# Patient Record
Sex: Female | Born: 1935 | Race: Black or African American | Hispanic: No | Marital: Single | Smoking: Former smoker
Health system: Southern US, Community
[De-identification: ages and names within clinical notes are randomized; demographics above are authoritative.]

## PROBLEM LIST (undated history)

## (undated) DIAGNOSIS — M1990 Unspecified osteoarthritis, unspecified site: Secondary | ICD-10-CM

## (undated) DIAGNOSIS — I471 Supraventricular tachycardia: Secondary | ICD-10-CM

## (undated) DIAGNOSIS — F419 Anxiety disorder, unspecified: Secondary | ICD-10-CM

## (undated) DIAGNOSIS — R0600 Dyspnea, unspecified: Secondary | ICD-10-CM

## (undated) DIAGNOSIS — I4719 Other supraventricular tachycardia: Secondary | ICD-10-CM

## (undated) DIAGNOSIS — F32A Depression, unspecified: Secondary | ICD-10-CM

## (undated) DIAGNOSIS — Z8719 Personal history of other diseases of the digestive system: Secondary | ICD-10-CM

## (undated) DIAGNOSIS — K5909 Other constipation: Secondary | ICD-10-CM

## (undated) DIAGNOSIS — R739 Hyperglycemia, unspecified: Secondary | ICD-10-CM

## (undated) DIAGNOSIS — I252 Old myocardial infarction: Secondary | ICD-10-CM

## (undated) DIAGNOSIS — G379 Demyelinating disease of central nervous system, unspecified: Secondary | ICD-10-CM

## (undated) DIAGNOSIS — J449 Chronic obstructive pulmonary disease, unspecified: Secondary | ICD-10-CM

## (undated) DIAGNOSIS — K279 Peptic ulcer, site unspecified, unspecified as acute or chronic, without hemorrhage or perforation: Secondary | ICD-10-CM

## (undated) DIAGNOSIS — I739 Peripheral vascular disease, unspecified: Secondary | ICD-10-CM

## (undated) DIAGNOSIS — D509 Iron deficiency anemia, unspecified: Secondary | ICD-10-CM

## (undated) DIAGNOSIS — G629 Polyneuropathy, unspecified: Secondary | ICD-10-CM

## (undated) DIAGNOSIS — I10 Essential (primary) hypertension: Secondary | ICD-10-CM

## (undated) DIAGNOSIS — R0609 Other forms of dyspnea: Secondary | ICD-10-CM

## (undated) DIAGNOSIS — F329 Major depressive disorder, single episode, unspecified: Secondary | ICD-10-CM

## (undated) DIAGNOSIS — I509 Heart failure, unspecified: Secondary | ICD-10-CM

## (undated) HISTORY — DX: Heart failure, unspecified: I50.9

## (undated) HISTORY — DX: Depression, unspecified: F32.A

## (undated) HISTORY — DX: Chronic obstructive pulmonary disease, unspecified: J44.9

## (undated) HISTORY — DX: Hyperglycemia, unspecified: R73.9

## (undated) HISTORY — DX: Peptic ulcer, site unspecified, unspecified as acute or chronic, without hemorrhage or perforation: K27.9

## (undated) HISTORY — DX: Old myocardial infarction: I25.2

## (undated) HISTORY — DX: Peripheral vascular disease, unspecified: I73.9

## (undated) HISTORY — DX: Anxiety disorder, unspecified: F41.9

## (undated) HISTORY — DX: Iron deficiency anemia, unspecified: D50.9

## (undated) HISTORY — DX: Major depressive disorder, single episode, unspecified: F32.9

## (undated) HISTORY — DX: Demyelinating disease of central nervous system, unspecified: G37.9

## (undated) HISTORY — PX: SPLENECTOMY: SUR1306

## (undated) HISTORY — DX: Personal history of other diseases of the digestive system: Z87.19

## (undated) HISTORY — PX: CHOLECYSTECTOMY: SHX55

## (undated) HISTORY — DX: Other supraventricular tachycardia: I47.19

## (undated) HISTORY — PX: CARDIAC CATHETERIZATION: SHX172

## (undated) HISTORY — DX: Dyspnea, unspecified: R06.00

## (undated) HISTORY — DX: Other constipation: K59.09

## (undated) HISTORY — PX: PARTIAL GASTRECTOMY: SHX2172

## (undated) HISTORY — DX: Essential (primary) hypertension: I10

## (undated) HISTORY — DX: Supraventricular tachycardia: I47.1

## (undated) HISTORY — DX: Other forms of dyspnea: R06.09

## (undated) HISTORY — DX: Unspecified osteoarthritis, unspecified site: M19.90

## (undated) HISTORY — DX: Polyneuropathy, unspecified: G62.9

---

## 1997-12-16 ENCOUNTER — Ambulatory Visit (HOSPITAL_COMMUNITY): Admission: RE | Admit: 1997-12-16 | Discharge: 1997-12-16 | Payer: Self-pay | Admitting: Gastroenterology

## 1999-10-29 ENCOUNTER — Ambulatory Visit (HOSPITAL_COMMUNITY): Admission: RE | Admit: 1999-10-29 | Discharge: 1999-10-29 | Payer: Self-pay | Admitting: Surgery

## 2000-10-14 ENCOUNTER — Encounter (HOSPITAL_COMMUNITY): Admission: RE | Admit: 2000-10-14 | Discharge: 2000-11-13 | Payer: Self-pay | Admitting: Oncology

## 2000-10-14 ENCOUNTER — Encounter (HOSPITAL_COMMUNITY): Payer: Self-pay | Admitting: Oncology

## 2000-10-14 ENCOUNTER — Encounter: Admission: RE | Admit: 2000-10-14 | Discharge: 2000-10-14 | Payer: Self-pay | Admitting: Oncology

## 2000-11-21 ENCOUNTER — Encounter: Admission: RE | Admit: 2000-11-21 | Discharge: 2000-11-21 | Payer: Self-pay | Admitting: Oncology

## 2000-11-21 ENCOUNTER — Encounter (HOSPITAL_COMMUNITY): Admission: RE | Admit: 2000-11-21 | Discharge: 2000-12-21 | Payer: Self-pay | Admitting: Oncology

## 2001-01-21 ENCOUNTER — Encounter (HOSPITAL_COMMUNITY): Admission: RE | Admit: 2001-01-21 | Discharge: 2001-02-20 | Payer: Self-pay | Admitting: Oncology

## 2001-01-21 ENCOUNTER — Encounter: Admission: RE | Admit: 2001-01-21 | Discharge: 2001-01-21 | Payer: Self-pay | Admitting: Oncology

## 2001-03-02 ENCOUNTER — Encounter (HOSPITAL_COMMUNITY): Admission: RE | Admit: 2001-03-02 | Discharge: 2001-04-01 | Payer: Self-pay | Admitting: Oncology

## 2001-03-02 ENCOUNTER — Encounter: Admission: RE | Admit: 2001-03-02 | Discharge: 2001-03-02 | Payer: Self-pay | Admitting: Oncology

## 2001-04-28 ENCOUNTER — Encounter (HOSPITAL_COMMUNITY): Admission: RE | Admit: 2001-04-28 | Discharge: 2001-05-28 | Payer: Self-pay | Admitting: Oncology

## 2001-04-28 ENCOUNTER — Encounter: Admission: RE | Admit: 2001-04-28 | Discharge: 2001-04-28 | Payer: Self-pay | Admitting: Oncology

## 2001-08-12 ENCOUNTER — Encounter (HOSPITAL_COMMUNITY): Admission: RE | Admit: 2001-08-12 | Discharge: 2001-09-11 | Payer: Self-pay | Admitting: Oncology

## 2001-08-12 ENCOUNTER — Encounter: Admission: RE | Admit: 2001-08-12 | Discharge: 2001-08-12 | Payer: Self-pay | Admitting: Oncology

## 2001-09-16 ENCOUNTER — Ambulatory Visit (HOSPITAL_COMMUNITY): Admission: RE | Admit: 2001-09-16 | Discharge: 2001-09-16 | Payer: Self-pay | Admitting: General Surgery

## 2001-09-17 ENCOUNTER — Encounter: Payer: Self-pay | Admitting: General Surgery

## 2001-09-17 ENCOUNTER — Encounter (HOSPITAL_COMMUNITY): Admission: RE | Admit: 2001-09-17 | Discharge: 2001-10-17 | Payer: Self-pay | Admitting: General Surgery

## 2001-09-18 ENCOUNTER — Encounter: Payer: Self-pay | Admitting: General Surgery

## 2001-10-13 ENCOUNTER — Observation Stay (HOSPITAL_COMMUNITY): Admission: RE | Admit: 2001-10-13 | Discharge: 2001-10-14 | Payer: Self-pay | Admitting: General Surgery

## 2001-11-04 ENCOUNTER — Encounter: Admission: RE | Admit: 2001-11-04 | Discharge: 2001-11-04 | Payer: Self-pay | Admitting: Oncology

## 2001-11-04 ENCOUNTER — Encounter (HOSPITAL_COMMUNITY): Admission: RE | Admit: 2001-11-04 | Discharge: 2001-12-04 | Payer: Self-pay | Admitting: Oncology

## 2002-01-04 ENCOUNTER — Encounter (HOSPITAL_COMMUNITY): Admission: RE | Admit: 2002-01-04 | Discharge: 2002-02-03 | Payer: Self-pay | Admitting: Oncology

## 2002-01-04 ENCOUNTER — Encounter: Admission: RE | Admit: 2002-01-04 | Discharge: 2002-01-04 | Payer: Self-pay | Admitting: Oncology

## 2002-03-03 ENCOUNTER — Encounter (HOSPITAL_COMMUNITY): Admission: RE | Admit: 2002-03-03 | Discharge: 2002-04-02 | Payer: Self-pay | Admitting: Rheumatology

## 2002-03-03 ENCOUNTER — Encounter: Admission: RE | Admit: 2002-03-03 | Discharge: 2002-03-03 | Payer: Self-pay | Admitting: Oncology

## 2002-06-21 ENCOUNTER — Inpatient Hospital Stay (HOSPITAL_COMMUNITY): Admission: EM | Admit: 2002-06-21 | Discharge: 2002-06-26 | Payer: Self-pay | Admitting: Emergency Medicine

## 2002-06-24 ENCOUNTER — Encounter: Payer: Self-pay | Admitting: General Surgery

## 2002-07-16 ENCOUNTER — Inpatient Hospital Stay (HOSPITAL_COMMUNITY): Admission: RE | Admit: 2002-07-16 | Discharge: 2002-07-19 | Payer: Self-pay | Admitting: General Surgery

## 2002-12-14 ENCOUNTER — Inpatient Hospital Stay (HOSPITAL_COMMUNITY): Admission: EM | Admit: 2002-12-14 | Discharge: 2002-12-30 | Payer: Self-pay | Admitting: *Deleted

## 2002-12-14 ENCOUNTER — Encounter: Payer: Self-pay | Admitting: *Deleted

## 2002-12-15 ENCOUNTER — Encounter: Payer: Self-pay | Admitting: Neurology

## 2002-12-15 ENCOUNTER — Encounter: Payer: Self-pay | Admitting: General Surgery

## 2002-12-20 ENCOUNTER — Encounter: Payer: Self-pay | Admitting: Internal Medicine

## 2002-12-20 ENCOUNTER — Encounter: Payer: Self-pay | Admitting: Neurology

## 2002-12-25 ENCOUNTER — Encounter: Payer: Self-pay | Admitting: General Surgery

## 2003-02-08 ENCOUNTER — Encounter: Admission: RE | Admit: 2003-02-08 | Discharge: 2003-02-08 | Payer: Self-pay | Admitting: Oncology

## 2003-02-08 ENCOUNTER — Encounter (HOSPITAL_COMMUNITY): Admission: RE | Admit: 2003-02-08 | Discharge: 2003-03-10 | Payer: Self-pay | Admitting: Oncology

## 2003-09-02 ENCOUNTER — Encounter: Admission: RE | Admit: 2003-09-02 | Discharge: 2003-09-02 | Payer: Self-pay | Admitting: Oncology

## 2003-09-02 ENCOUNTER — Encounter (HOSPITAL_COMMUNITY): Admission: RE | Admit: 2003-09-02 | Discharge: 2003-10-02 | Payer: Self-pay | Admitting: Oncology

## 2003-10-05 ENCOUNTER — Encounter: Admission: RE | Admit: 2003-10-05 | Discharge: 2003-10-05 | Payer: Self-pay | Admitting: Oncology

## 2004-03-07 ENCOUNTER — Encounter: Admission: RE | Admit: 2004-03-07 | Discharge: 2004-04-06 | Payer: Self-pay | Admitting: Oncology

## 2004-03-07 ENCOUNTER — Encounter (HOSPITAL_COMMUNITY): Admission: RE | Admit: 2004-03-07 | Discharge: 2004-04-06 | Payer: Self-pay | Admitting: Oncology

## 2004-03-10 ENCOUNTER — Inpatient Hospital Stay (HOSPITAL_COMMUNITY): Admission: EM | Admit: 2004-03-10 | Discharge: 2004-03-15 | Payer: Self-pay | Admitting: Internal Medicine

## 2004-03-26 ENCOUNTER — Encounter (HOSPITAL_COMMUNITY): Admission: RE | Admit: 2004-03-26 | Discharge: 2004-03-27 | Payer: Self-pay | Admitting: Endocrinology

## 2005-04-29 ENCOUNTER — Ambulatory Visit (HOSPITAL_COMMUNITY): Admission: RE | Admit: 2005-04-29 | Discharge: 2005-04-29 | Payer: Self-pay | Admitting: General Surgery

## 2005-04-29 ENCOUNTER — Ambulatory Visit: Payer: Self-pay | Admitting: *Deleted

## 2005-05-15 ENCOUNTER — Ambulatory Visit (HOSPITAL_COMMUNITY): Admission: RE | Admit: 2005-05-15 | Discharge: 2005-05-15 | Payer: Self-pay | Admitting: General Surgery

## 2005-05-30 ENCOUNTER — Inpatient Hospital Stay (HOSPITAL_COMMUNITY): Admission: EM | Admit: 2005-05-30 | Discharge: 2005-06-01 | Payer: Self-pay | Admitting: Emergency Medicine

## 2005-05-31 ENCOUNTER — Encounter (INDEPENDENT_AMBULATORY_CARE_PROVIDER_SITE_OTHER): Payer: Self-pay | Admitting: General Surgery

## 2005-07-09 ENCOUNTER — Ambulatory Visit (HOSPITAL_COMMUNITY): Admission: RE | Admit: 2005-07-09 | Discharge: 2005-07-09 | Payer: Self-pay | Admitting: General Surgery

## 2006-02-17 ENCOUNTER — Ambulatory Visit: Payer: Self-pay | Admitting: Internal Medicine

## 2006-02-28 ENCOUNTER — Ambulatory Visit: Payer: Self-pay | Admitting: Internal Medicine

## 2006-02-28 LAB — CONVERTED CEMR LAB
Hemoglobin, Urine: NEGATIVE
Leukocytes, UA: NEGATIVE
Protein, ur: NEGATIVE mg/dL
Urine Glucose: NEGATIVE mg/dL

## 2006-03-04 LAB — CONVERTED CEMR LAB
HDL: 49 mg/dL
Total CHOL/HDL Ratio: 4

## 2006-03-28 ENCOUNTER — Ambulatory Visit: Payer: Self-pay | Admitting: Internal Medicine

## 2006-04-03 ENCOUNTER — Inpatient Hospital Stay (HOSPITAL_COMMUNITY): Admission: EM | Admit: 2006-04-03 | Discharge: 2006-04-08 | Payer: Self-pay | Admitting: Emergency Medicine

## 2006-04-03 ENCOUNTER — Ambulatory Visit: Payer: Self-pay | Admitting: Internal Medicine

## 2006-04-10 ENCOUNTER — Ambulatory Visit (HOSPITAL_COMMUNITY): Admission: RE | Admit: 2006-04-10 | Discharge: 2006-04-10 | Payer: Self-pay | Admitting: Internal Medicine

## 2006-04-10 ENCOUNTER — Ambulatory Visit: Payer: Self-pay | Admitting: Internal Medicine

## 2006-04-21 ENCOUNTER — Ambulatory Visit (HOSPITAL_COMMUNITY): Admission: RE | Admit: 2006-04-21 | Discharge: 2006-04-21 | Payer: Self-pay | Admitting: Gastroenterology

## 2006-04-22 ENCOUNTER — Ambulatory Visit: Payer: Self-pay | Admitting: Gastroenterology

## 2006-04-25 ENCOUNTER — Ambulatory Visit: Payer: Self-pay | Admitting: Internal Medicine

## 2006-04-30 ENCOUNTER — Ambulatory Visit: Payer: Self-pay | Admitting: Cardiovascular Disease

## 2006-05-06 ENCOUNTER — Encounter (HOSPITAL_COMMUNITY): Admission: RE | Admit: 2006-05-06 | Discharge: 2006-06-05 | Payer: Self-pay | Admitting: Cardiovascular Disease

## 2006-05-08 ENCOUNTER — Ambulatory Visit: Payer: Self-pay | Admitting: Gastroenterology

## 2006-05-23 ENCOUNTER — Ambulatory Visit: Payer: Self-pay | Admitting: Internal Medicine

## 2006-05-28 DIAGNOSIS — J4489 Other specified chronic obstructive pulmonary disease: Secondary | ICD-10-CM | POA: Insufficient documentation

## 2006-05-28 DIAGNOSIS — I10 Essential (primary) hypertension: Secondary | ICD-10-CM | POA: Insufficient documentation

## 2006-05-28 DIAGNOSIS — Z8719 Personal history of other diseases of the digestive system: Secondary | ICD-10-CM | POA: Insufficient documentation

## 2006-05-28 DIAGNOSIS — F329 Major depressive disorder, single episode, unspecified: Secondary | ICD-10-CM | POA: Insufficient documentation

## 2006-05-28 DIAGNOSIS — I739 Peripheral vascular disease, unspecified: Secondary | ICD-10-CM

## 2006-05-28 DIAGNOSIS — G609 Hereditary and idiopathic neuropathy, unspecified: Secondary | ICD-10-CM | POA: Insufficient documentation

## 2006-05-28 DIAGNOSIS — F3289 Other specified depressive episodes: Secondary | ICD-10-CM | POA: Insufficient documentation

## 2006-05-28 DIAGNOSIS — K5909 Other constipation: Secondary | ICD-10-CM | POA: Insufficient documentation

## 2006-05-28 DIAGNOSIS — F411 Generalized anxiety disorder: Secondary | ICD-10-CM | POA: Insufficient documentation

## 2006-05-28 DIAGNOSIS — I252 Old myocardial infarction: Secondary | ICD-10-CM

## 2006-05-28 DIAGNOSIS — M1990 Unspecified osteoarthritis, unspecified site: Secondary | ICD-10-CM | POA: Insufficient documentation

## 2006-05-28 DIAGNOSIS — M129 Arthropathy, unspecified: Secondary | ICD-10-CM | POA: Insufficient documentation

## 2006-05-28 DIAGNOSIS — K279 Peptic ulcer, site unspecified, unspecified as acute or chronic, without hemorrhage or perforation: Secondary | ICD-10-CM | POA: Insufficient documentation

## 2006-05-28 DIAGNOSIS — J449 Chronic obstructive pulmonary disease, unspecified: Secondary | ICD-10-CM | POA: Insufficient documentation

## 2006-06-10 LAB — CONVERTED CEMR LAB
Free T4: 0.91 ng/dL
TSH: 0.3 microintl units/mL

## 2006-06-13 ENCOUNTER — Ambulatory Visit: Payer: Self-pay | Admitting: Internal Medicine

## 2006-07-11 ENCOUNTER — Ambulatory Visit: Payer: Self-pay | Admitting: Internal Medicine

## 2006-08-08 ENCOUNTER — Ambulatory Visit: Payer: Self-pay | Admitting: Internal Medicine

## 2006-08-09 ENCOUNTER — Encounter (INDEPENDENT_AMBULATORY_CARE_PROVIDER_SITE_OTHER): Payer: Self-pay | Admitting: Internal Medicine

## 2006-08-09 LAB — CONVERTED CEMR LAB
BUN: 18 mg/dL (ref 6–23)
CO2: 27 meq/L (ref 19–32)
Calcium: 9 mg/dL (ref 8.4–10.5)
Creatinine, Ser: 1.01 mg/dL (ref 0.40–1.20)
Eosinophils Relative: 1 % (ref 0–5)
Glucose, Bld: 185 mg/dL — ABNORMAL HIGH (ref 70–99)
HCT: 38.1 % (ref 36.0–46.0)
Hemoglobin: 11.9 g/dL — ABNORMAL LOW (ref 12.0–15.0)
Lymphocytes Relative: 24 % (ref 12–46)
Lymphs Abs: 1.6 10*3/uL (ref 0.7–3.3)
MCV: 89 fL (ref 78.0–100.0)
Monocytes Absolute: 0.5 10*3/uL (ref 0.2–0.7)
Monocytes Relative: 7 % (ref 3–11)
RBC: 4.28 M/uL (ref 3.87–5.11)
Saturation Ratios: 12 % — ABNORMAL LOW (ref 20–55)
Sodium: 144 meq/L (ref 135–145)
WBC: 6.5 10*3/uL (ref 4.0–10.5)

## 2006-08-28 ENCOUNTER — Ambulatory Visit: Payer: Self-pay | Admitting: Gastroenterology

## 2006-08-28 ENCOUNTER — Encounter (INDEPENDENT_AMBULATORY_CARE_PROVIDER_SITE_OTHER): Payer: Self-pay | Admitting: Internal Medicine

## 2006-09-03 ENCOUNTER — Encounter (INDEPENDENT_AMBULATORY_CARE_PROVIDER_SITE_OTHER): Payer: Self-pay | Admitting: Internal Medicine

## 2006-09-04 ENCOUNTER — Ambulatory Visit: Payer: Self-pay | Admitting: Internal Medicine

## 2006-09-04 ENCOUNTER — Ambulatory Visit (HOSPITAL_COMMUNITY): Admission: RE | Admit: 2006-09-04 | Discharge: 2006-09-04 | Payer: Self-pay | Admitting: Internal Medicine

## 2006-09-04 DIAGNOSIS — R0989 Other specified symptoms and signs involving the circulatory and respiratory systems: Secondary | ICD-10-CM

## 2006-09-04 DIAGNOSIS — R42 Dizziness and giddiness: Secondary | ICD-10-CM

## 2006-09-18 ENCOUNTER — Encounter (INDEPENDENT_AMBULATORY_CARE_PROVIDER_SITE_OTHER): Payer: Self-pay | Admitting: Internal Medicine

## 2006-09-19 ENCOUNTER — Encounter (INDEPENDENT_AMBULATORY_CARE_PROVIDER_SITE_OTHER): Payer: Self-pay | Admitting: Internal Medicine

## 2006-10-10 ENCOUNTER — Encounter (INDEPENDENT_AMBULATORY_CARE_PROVIDER_SITE_OTHER): Payer: Self-pay | Admitting: Internal Medicine

## 2006-10-14 ENCOUNTER — Ambulatory Visit: Payer: Self-pay | Admitting: Internal Medicine

## 2006-10-14 DIAGNOSIS — R0789 Other chest pain: Secondary | ICD-10-CM | POA: Insufficient documentation

## 2006-10-14 DIAGNOSIS — R112 Nausea with vomiting, unspecified: Secondary | ICD-10-CM | POA: Insufficient documentation

## 2006-10-14 DIAGNOSIS — F17200 Nicotine dependence, unspecified, uncomplicated: Secondary | ICD-10-CM | POA: Insufficient documentation

## 2006-10-14 DIAGNOSIS — Z9119 Patient's noncompliance with other medical treatment and regimen: Secondary | ICD-10-CM | POA: Insufficient documentation

## 2006-10-15 ENCOUNTER — Inpatient Hospital Stay (HOSPITAL_COMMUNITY): Admission: EM | Admit: 2006-10-15 | Discharge: 2006-10-25 | Payer: Self-pay | Admitting: Emergency Medicine

## 2006-10-15 ENCOUNTER — Telehealth (INDEPENDENT_AMBULATORY_CARE_PROVIDER_SITE_OTHER): Payer: Self-pay | Admitting: Internal Medicine

## 2006-10-16 ENCOUNTER — Ambulatory Visit: Payer: Self-pay | Admitting: Gastroenterology

## 2006-10-16 ENCOUNTER — Encounter (INDEPENDENT_AMBULATORY_CARE_PROVIDER_SITE_OTHER): Payer: Self-pay | Admitting: Specialist

## 2006-10-18 ENCOUNTER — Ambulatory Visit: Payer: Self-pay | Admitting: Internal Medicine

## 2006-10-30 ENCOUNTER — Encounter (INDEPENDENT_AMBULATORY_CARE_PROVIDER_SITE_OTHER): Payer: Self-pay | Admitting: Internal Medicine

## 2006-11-02 ENCOUNTER — Inpatient Hospital Stay (HOSPITAL_COMMUNITY): Admission: EM | Admit: 2006-11-02 | Discharge: 2006-11-10 | Payer: Self-pay | Admitting: Emergency Medicine

## 2006-11-07 ENCOUNTER — Ambulatory Visit: Payer: Self-pay | Admitting: Oncology

## 2006-11-10 ENCOUNTER — Telehealth (INDEPENDENT_AMBULATORY_CARE_PROVIDER_SITE_OTHER): Payer: Self-pay | Admitting: Internal Medicine

## 2006-11-12 ENCOUNTER — Encounter (INDEPENDENT_AMBULATORY_CARE_PROVIDER_SITE_OTHER): Payer: Self-pay | Admitting: Internal Medicine

## 2006-11-12 ENCOUNTER — Telehealth (INDEPENDENT_AMBULATORY_CARE_PROVIDER_SITE_OTHER): Payer: Self-pay | Admitting: *Deleted

## 2006-11-13 ENCOUNTER — Ambulatory Visit: Payer: Self-pay | Admitting: Cardiology

## 2006-11-13 ENCOUNTER — Encounter (INDEPENDENT_AMBULATORY_CARE_PROVIDER_SITE_OTHER): Payer: Self-pay | Admitting: Internal Medicine

## 2006-11-14 ENCOUNTER — Encounter: Payer: Self-pay | Admitting: Cardiology

## 2006-11-20 ENCOUNTER — Telehealth (INDEPENDENT_AMBULATORY_CARE_PROVIDER_SITE_OTHER): Payer: Self-pay | Admitting: Internal Medicine

## 2006-11-20 ENCOUNTER — Encounter (INDEPENDENT_AMBULATORY_CARE_PROVIDER_SITE_OTHER): Payer: Self-pay | Admitting: Internal Medicine

## 2006-11-21 DIAGNOSIS — J9600 Acute respiratory failure, unspecified whether with hypoxia or hypercapnia: Secondary | ICD-10-CM

## 2006-11-21 DIAGNOSIS — K252 Acute gastric ulcer with both hemorrhage and perforation: Secondary | ICD-10-CM | POA: Insufficient documentation

## 2006-11-25 ENCOUNTER — Ambulatory Visit: Payer: Self-pay | Admitting: Internal Medicine

## 2006-11-25 DIAGNOSIS — I509 Heart failure, unspecified: Secondary | ICD-10-CM | POA: Insufficient documentation

## 2006-11-25 DIAGNOSIS — R7989 Other specified abnormal findings of blood chemistry: Secondary | ICD-10-CM | POA: Insufficient documentation

## 2006-11-25 DIAGNOSIS — L02519 Cutaneous abscess of unspecified hand: Secondary | ICD-10-CM

## 2006-11-25 DIAGNOSIS — L03119 Cellulitis of unspecified part of limb: Secondary | ICD-10-CM

## 2006-11-25 LAB — CONVERTED CEMR LAB: Hgb A1c MFr Bld: 6.6 %

## 2006-11-28 ENCOUNTER — Ambulatory Visit: Payer: Self-pay | Admitting: Internal Medicine

## 2006-12-08 ENCOUNTER — Encounter (INDEPENDENT_AMBULATORY_CARE_PROVIDER_SITE_OTHER): Payer: Self-pay | Admitting: Internal Medicine

## 2006-12-11 ENCOUNTER — Ambulatory Visit: Payer: Self-pay | Admitting: Internal Medicine

## 2006-12-11 DIAGNOSIS — Z9089 Acquired absence of other organs: Secondary | ICD-10-CM

## 2006-12-12 ENCOUNTER — Encounter (INDEPENDENT_AMBULATORY_CARE_PROVIDER_SITE_OTHER): Payer: Self-pay | Admitting: Internal Medicine

## 2006-12-12 LAB — CONVERTED CEMR LAB
BUN: 9 mg/dL (ref 6–23)
Calcium: 8.9 mg/dL (ref 8.4–10.5)
Creatinine, Ser: 0.77 mg/dL (ref 0.40–1.20)
Glucose, Bld: 94 mg/dL (ref 70–99)
Potassium: 4.6 meq/L (ref 3.5–5.3)

## 2006-12-15 ENCOUNTER — Encounter (INDEPENDENT_AMBULATORY_CARE_PROVIDER_SITE_OTHER): Payer: Self-pay | Admitting: Internal Medicine

## 2006-12-17 ENCOUNTER — Encounter (INDEPENDENT_AMBULATORY_CARE_PROVIDER_SITE_OTHER): Payer: Self-pay | Admitting: Internal Medicine

## 2006-12-18 ENCOUNTER — Ambulatory Visit: Payer: Self-pay | Admitting: Nurse Practitioner

## 2006-12-19 ENCOUNTER — Ambulatory Visit: Payer: Self-pay | Admitting: Internal Medicine

## 2006-12-23 ENCOUNTER — Telehealth (INDEPENDENT_AMBULATORY_CARE_PROVIDER_SITE_OTHER): Payer: Self-pay | Admitting: Internal Medicine

## 2006-12-26 ENCOUNTER — Ambulatory Visit: Payer: Self-pay | Admitting: Internal Medicine

## 2006-12-26 DIAGNOSIS — S61409A Unspecified open wound of unspecified hand, initial encounter: Secondary | ICD-10-CM | POA: Insufficient documentation

## 2007-01-08 ENCOUNTER — Ambulatory Visit: Payer: Self-pay | Admitting: Internal Medicine

## 2007-02-11 ENCOUNTER — Encounter (INDEPENDENT_AMBULATORY_CARE_PROVIDER_SITE_OTHER): Payer: Self-pay | Admitting: Internal Medicine

## 2007-02-19 ENCOUNTER — Ambulatory Visit: Payer: Self-pay | Admitting: Internal Medicine

## 2007-02-19 DIAGNOSIS — I7389 Other specified peripheral vascular diseases: Secondary | ICD-10-CM

## 2007-02-19 DIAGNOSIS — E119 Type 2 diabetes mellitus without complications: Secondary | ICD-10-CM

## 2007-02-19 DIAGNOSIS — M79609 Pain in unspecified limb: Secondary | ICD-10-CM | POA: Insufficient documentation

## 2007-02-19 LAB — CONVERTED CEMR LAB: Hgb A1c MFr Bld: 6.2 %

## 2007-02-20 ENCOUNTER — Ambulatory Visit: Payer: Self-pay | Admitting: Gastroenterology

## 2007-02-20 ENCOUNTER — Telehealth (INDEPENDENT_AMBULATORY_CARE_PROVIDER_SITE_OTHER): Payer: Self-pay | Admitting: *Deleted

## 2007-02-20 ENCOUNTER — Encounter (INDEPENDENT_AMBULATORY_CARE_PROVIDER_SITE_OTHER): Payer: Self-pay | Admitting: Internal Medicine

## 2007-02-24 ENCOUNTER — Telehealth (INDEPENDENT_AMBULATORY_CARE_PROVIDER_SITE_OTHER): Payer: Self-pay | Admitting: *Deleted

## 2007-02-27 ENCOUNTER — Encounter (INDEPENDENT_AMBULATORY_CARE_PROVIDER_SITE_OTHER): Payer: Self-pay | Admitting: Internal Medicine

## 2007-02-28 ENCOUNTER — Encounter (INDEPENDENT_AMBULATORY_CARE_PROVIDER_SITE_OTHER): Payer: Self-pay | Admitting: Internal Medicine

## 2007-03-11 ENCOUNTER — Ambulatory Visit: Payer: Self-pay | Admitting: Internal Medicine

## 2007-03-27 ENCOUNTER — Ambulatory Visit: Payer: Self-pay | Admitting: Cardiology

## 2007-04-20 ENCOUNTER — Ambulatory Visit: Payer: Self-pay | Admitting: Surgery

## 2007-04-29 ENCOUNTER — Encounter (INDEPENDENT_AMBULATORY_CARE_PROVIDER_SITE_OTHER): Payer: Self-pay | Admitting: Internal Medicine

## 2007-04-30 ENCOUNTER — Inpatient Hospital Stay (HOSPITAL_COMMUNITY): Admission: EM | Admit: 2007-04-30 | Discharge: 2007-05-04 | Payer: Self-pay | Admitting: Emergency Medicine

## 2007-04-30 ENCOUNTER — Encounter (INDEPENDENT_AMBULATORY_CARE_PROVIDER_SITE_OTHER): Payer: Self-pay | Admitting: Internal Medicine

## 2007-04-30 ENCOUNTER — Telehealth (INDEPENDENT_AMBULATORY_CARE_PROVIDER_SITE_OTHER): Payer: Self-pay | Admitting: Internal Medicine

## 2007-04-30 ENCOUNTER — Ambulatory Visit (HOSPITAL_COMMUNITY): Admission: RE | Admit: 2007-04-30 | Discharge: 2007-04-30 | Payer: Self-pay | Admitting: Surgery

## 2007-04-30 ENCOUNTER — Ambulatory Visit: Payer: Self-pay | Admitting: Internal Medicine

## 2007-04-30 LAB — CONVERTED CEMR LAB
HCT: 18.1 %
Platelets: 658 10*3/uL
RDW: 20.3 %
WBC: 10.2 10*3/uL

## 2007-05-04 ENCOUNTER — Encounter (INDEPENDENT_AMBULATORY_CARE_PROVIDER_SITE_OTHER): Payer: Self-pay | Admitting: Internal Medicine

## 2007-05-07 ENCOUNTER — Encounter (INDEPENDENT_AMBULATORY_CARE_PROVIDER_SITE_OTHER): Payer: Self-pay | Admitting: Internal Medicine

## 2007-05-08 ENCOUNTER — Ambulatory Visit: Payer: Self-pay | Admitting: Internal Medicine

## 2007-05-12 ENCOUNTER — Encounter (INDEPENDENT_AMBULATORY_CARE_PROVIDER_SITE_OTHER): Payer: Self-pay | Admitting: Internal Medicine

## 2007-06-29 ENCOUNTER — Encounter (INDEPENDENT_AMBULATORY_CARE_PROVIDER_SITE_OTHER): Payer: Self-pay | Admitting: Internal Medicine

## 2007-08-07 ENCOUNTER — Encounter: Payer: Self-pay | Admitting: Cardiology

## 2007-10-09 ENCOUNTER — Telehealth (INDEPENDENT_AMBULATORY_CARE_PROVIDER_SITE_OTHER): Payer: Self-pay | Admitting: Internal Medicine

## 2007-11-07 IMAGING — CT CT ANGIO CHEST
2 of 4 series · 19 of 36 positions shown · IV contrast (Omnipaque 300)
Comparison: none

CLINICAL DATA: Shortness of breath. Recent cholecystectomy.

[Series 6: mpr coronal pe cor · coronal · 0.56mm/px · 3 of 81 slices shown]
[im 17/81  mediastinal]
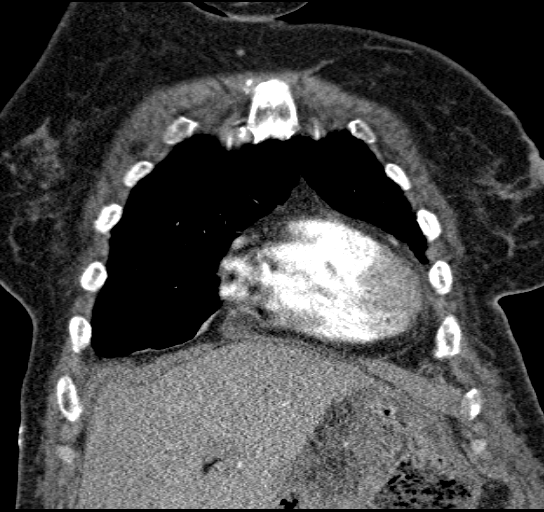
[im 33/81  mediastinal]
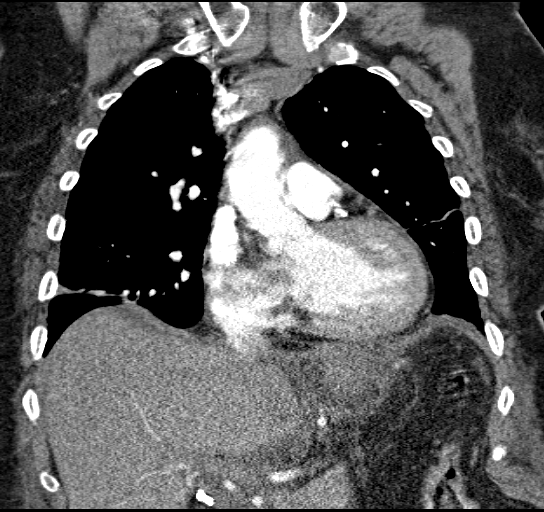
[im 49/81  mediastinal]
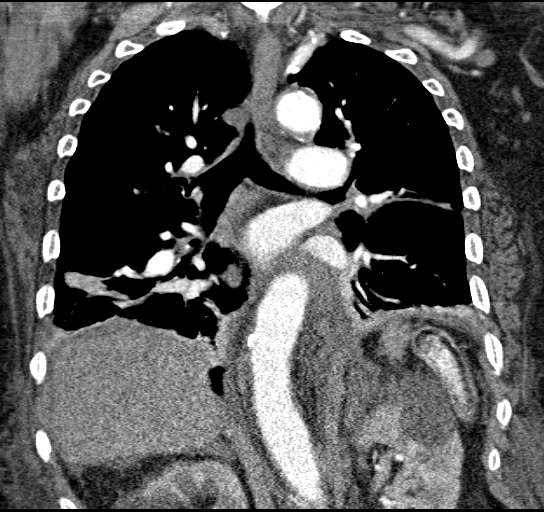

[Series 8: thin pacs · axial · 0.74mm/px · z∈[-268,-12]mm · 16 of 286 slices shown]
[im 15/286  lung]
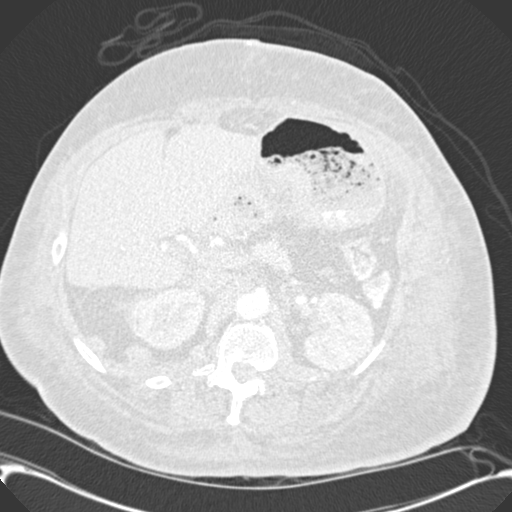
[im 29/286  mediastinal]
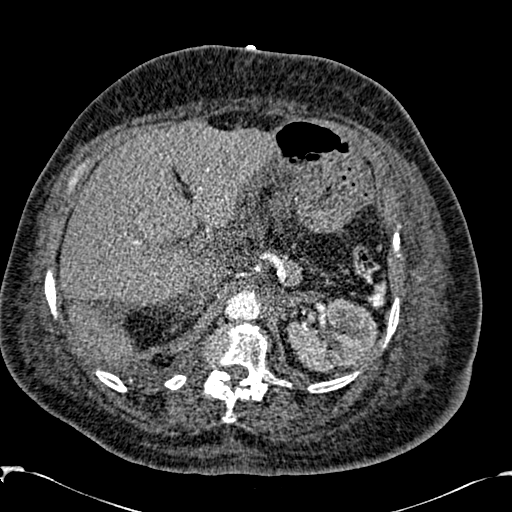
[im 43/286  lung]
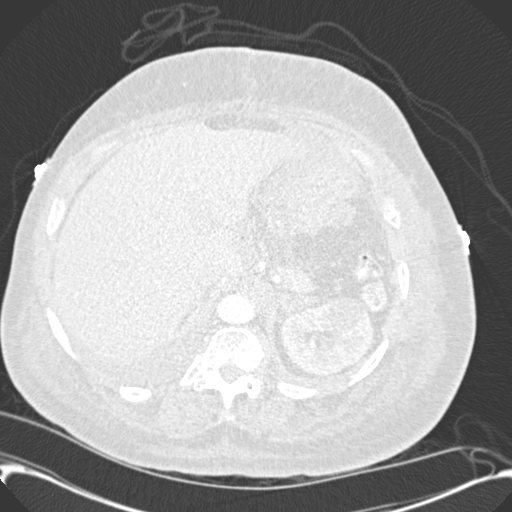
[im 72/286  mediastinal]
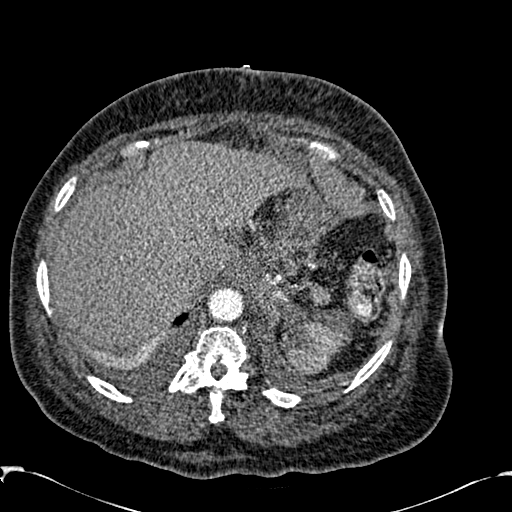
[im 86/286  lung]
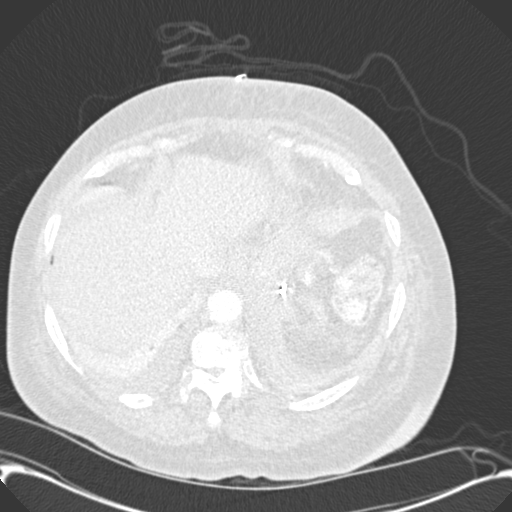
[im 100/286  mediastinal]
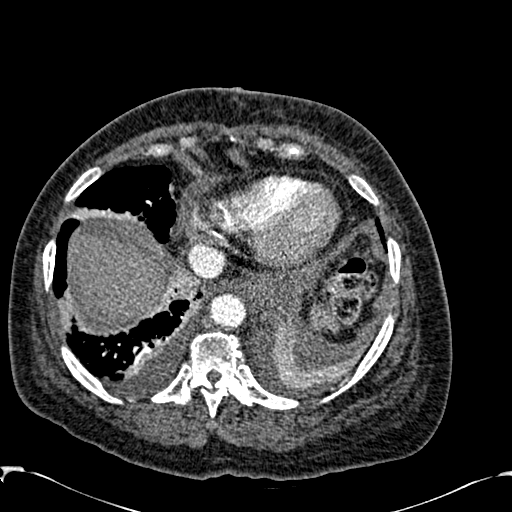
[im 115/286  lung]
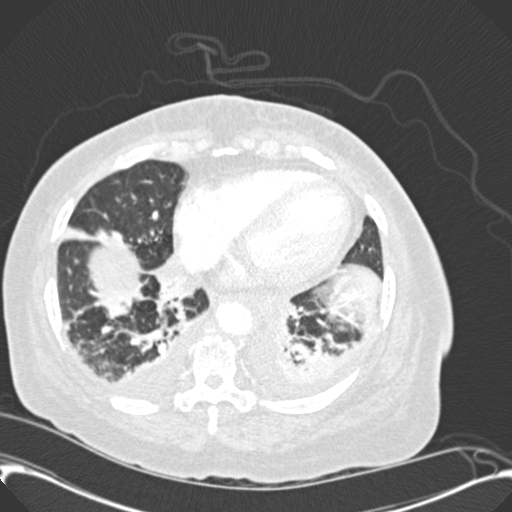
[im 129/286  mediastinal]
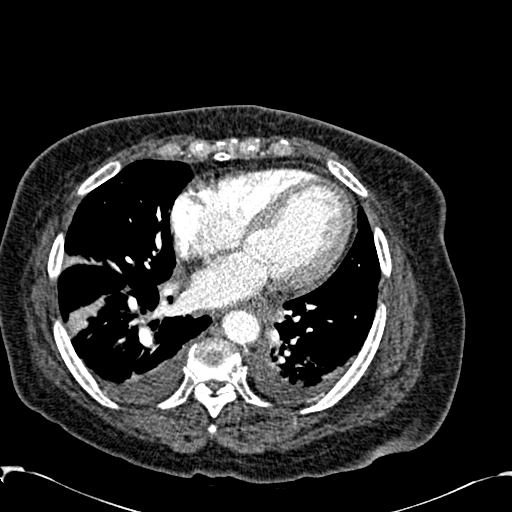
[im 157/286  lung]
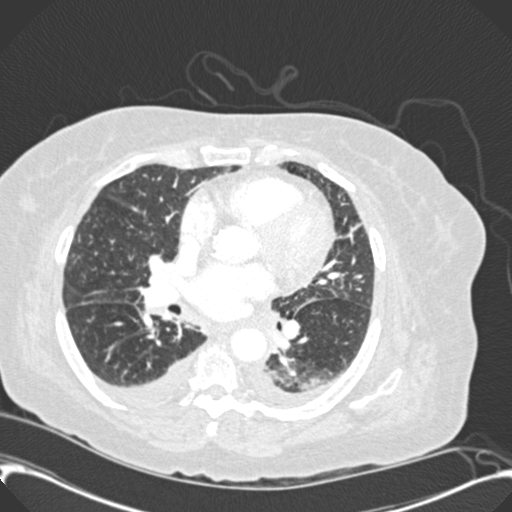
[im 172/286  mediastinal]
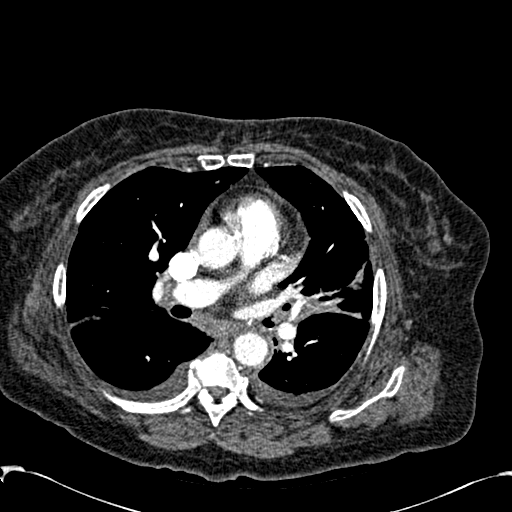
[im 186/286  lung]
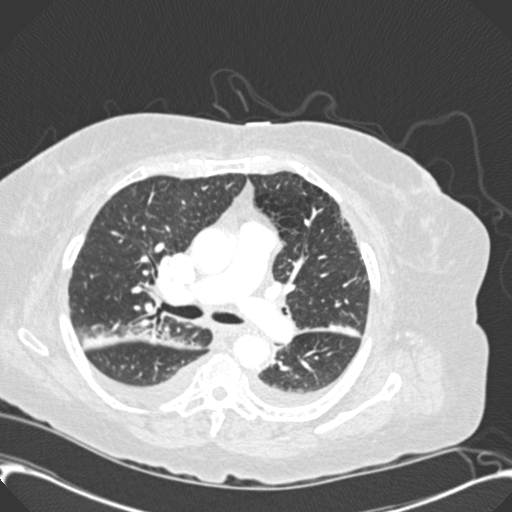
[im 200/286  mediastinal]
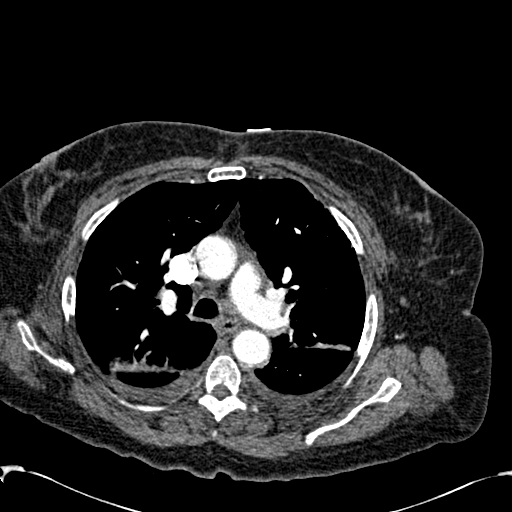
[im 214/286  lung]
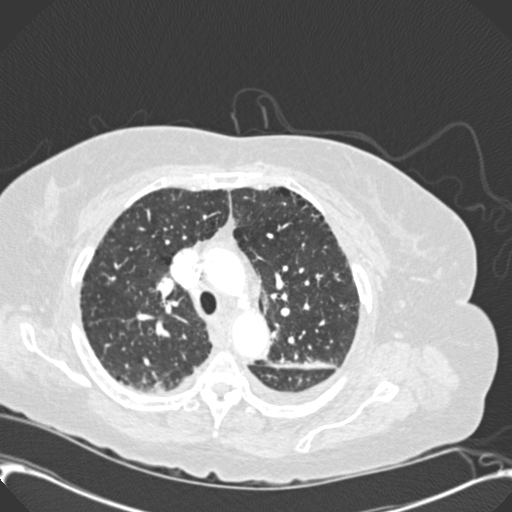
[im 243/286  mediastinal]
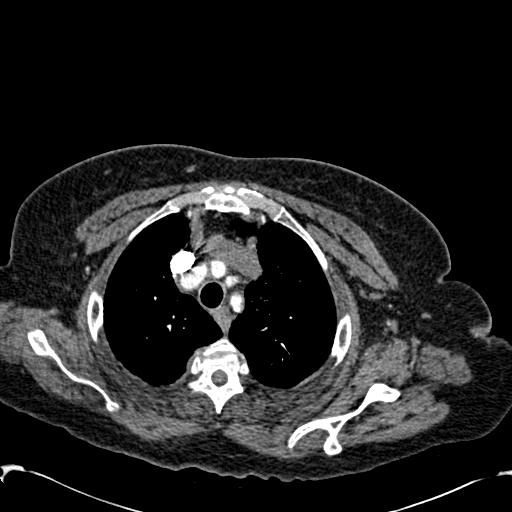
[im 257/286  lung]
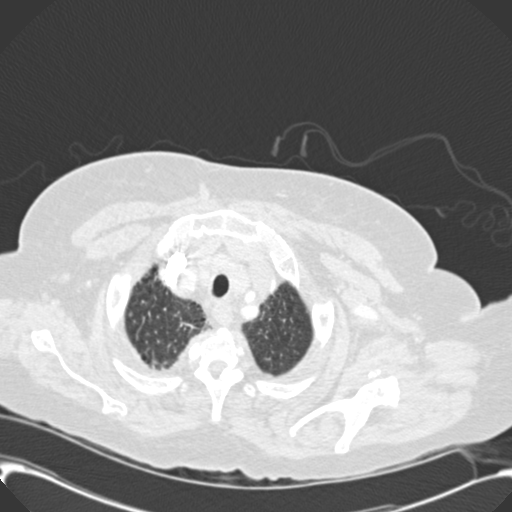
[im 271/286  mediastinal]
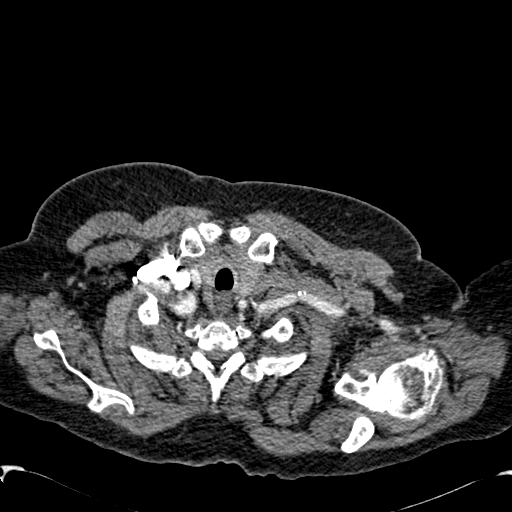

[19 of 36 positions shown; findings below may reference images not displayed]

CT angiogram chest with contrast:

Multidetector helical CT of the chest was obtained after 100 ml Omnipaque 300 
IV. CT multiplanar reconstructions were rendered to evaluate the vascular
anatomy.
Comparison 10/15/2006. Good contrast opacification of pulmonary artery branches.
No convincing filling defects to suggest acute PE. Atherosclerotic
calcifications in the aortic arch. New small bilateral pleural effusions.
Coronary calcifications. No hilar or mediastinal adenopathy. Platelike
atelectasis in the posterior aspect of both lower lobes, lingula, right middle
lobe, and right lower lobe. Dependent atelectasis posteriorly in both lung
bases. Patchy groundglass opacities in a subpleural distribution in the right
middle lobe and both lower lobes. Vascular clips in the gallbladder fossa.
Bilateral renal cysts. Vascular clips at the GE junction. Spondylitic changes in
the mid and lower thoracic spine. Coronal and sagittal reconstructions confirm
the above findings.
IMPRESSION: 1. Negative for acute PE or thoracic aortic dissection.
2. Coronary and aortic calcifications.
3. New bilateral pleural effusions with patchy areas of atelectasis or possibly
infiltrate in both lungs as detailed above.
4. Postop changes in the upper abdomen.

## 2007-11-07 IMAGING — CR DG CHEST 1V PORT
1 series · 1 of 1 positions shown · non-contrast
Comparison: 10/20/2006

CLINICAL DATA: Shortness of breath. Wheezing. Draining surgical wound. 
 PORTABLE CHEST:

[view not recorded]
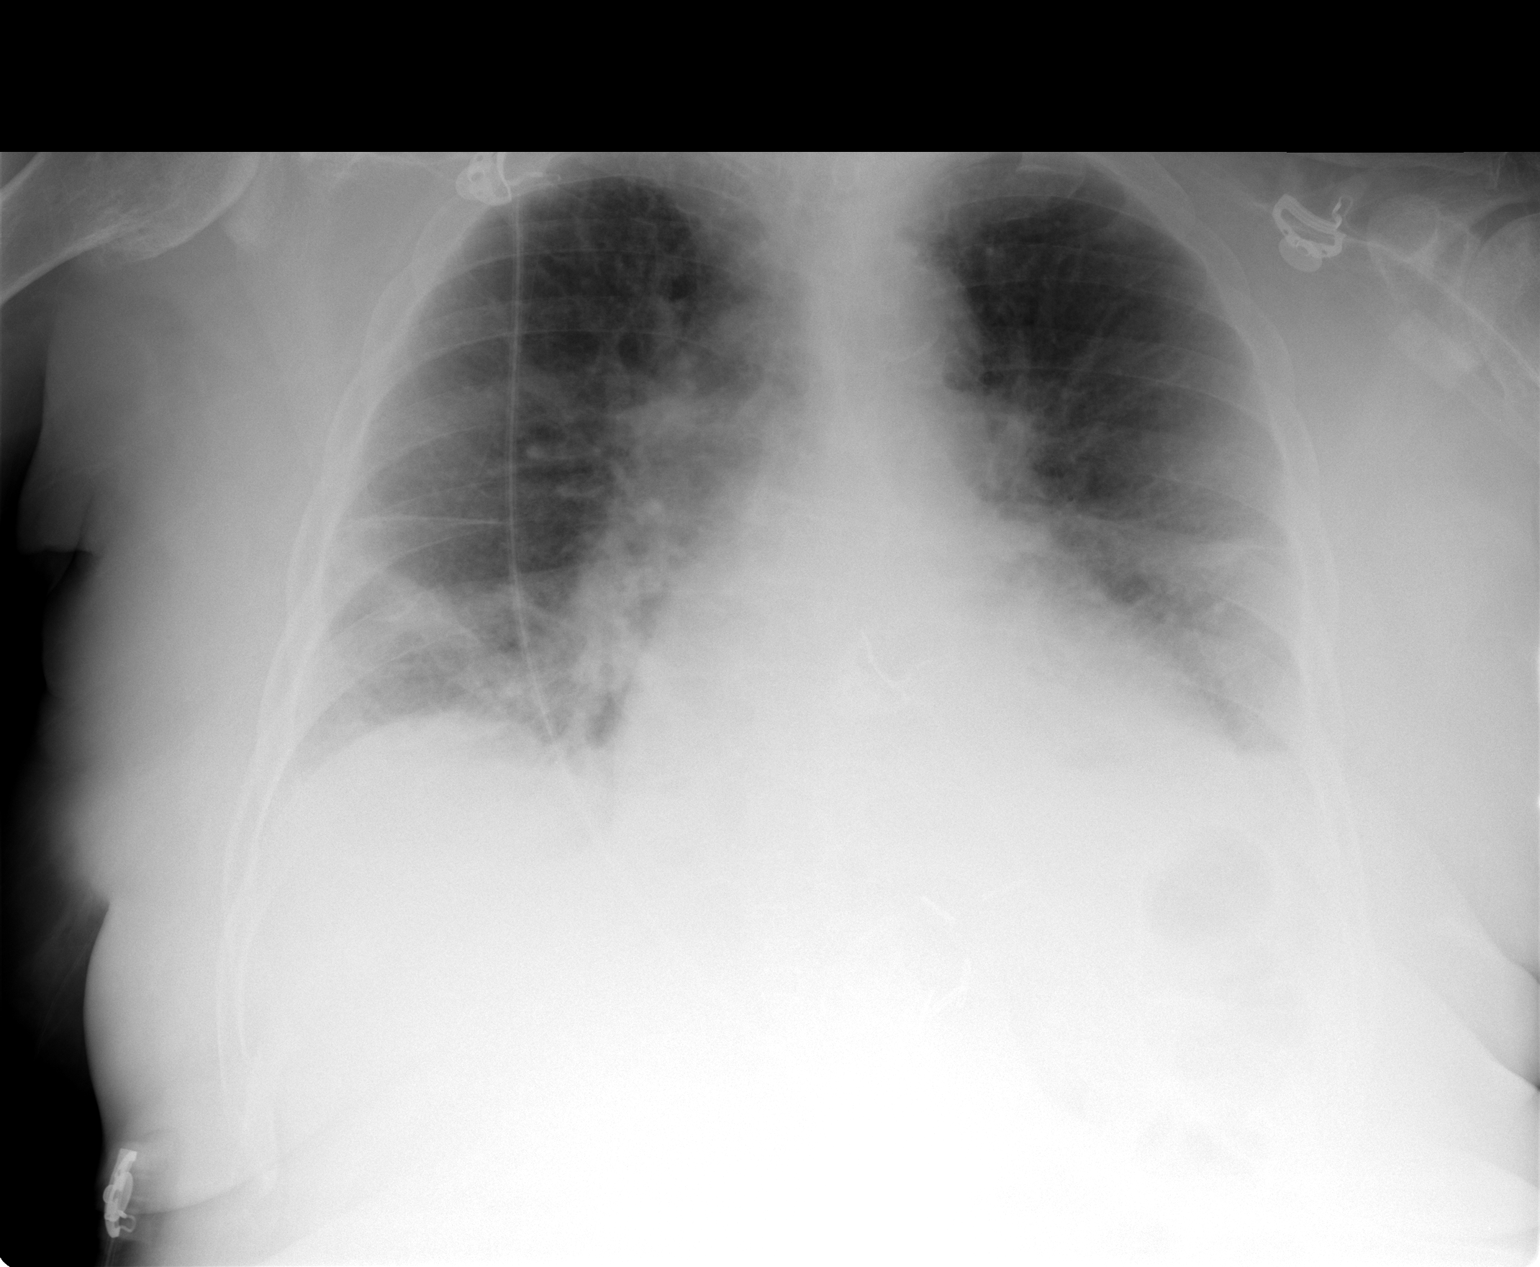

[1 of 1 positions shown; findings below may reference images not displayed]

FINDINGS: Low lung volumes are seen with new bibasilar opacity, which may be due to atelectasis or infiltrate. Cardiomegaly is stable. Some scarring in the left midlung zone is unchanged. Chronic pulmonary vascular congestion is again noted.
IMPRESSION: 1.  Low lung volumes with development of bibasilar atelectasis or infiltrate since prior study.
 2.  Stable cardiomegaly and pulmonary vascular congestion.

## 2007-11-10 IMAGING — CR DG CHEST 1V PORT
1 series · 1 of 1 positions shown · non-contrast
Comparison: 11/02/06.

CLINICAL DATA: Pneumonia.  PICC line placement. 
 PORTABLE CHEST:

[view not recorded]
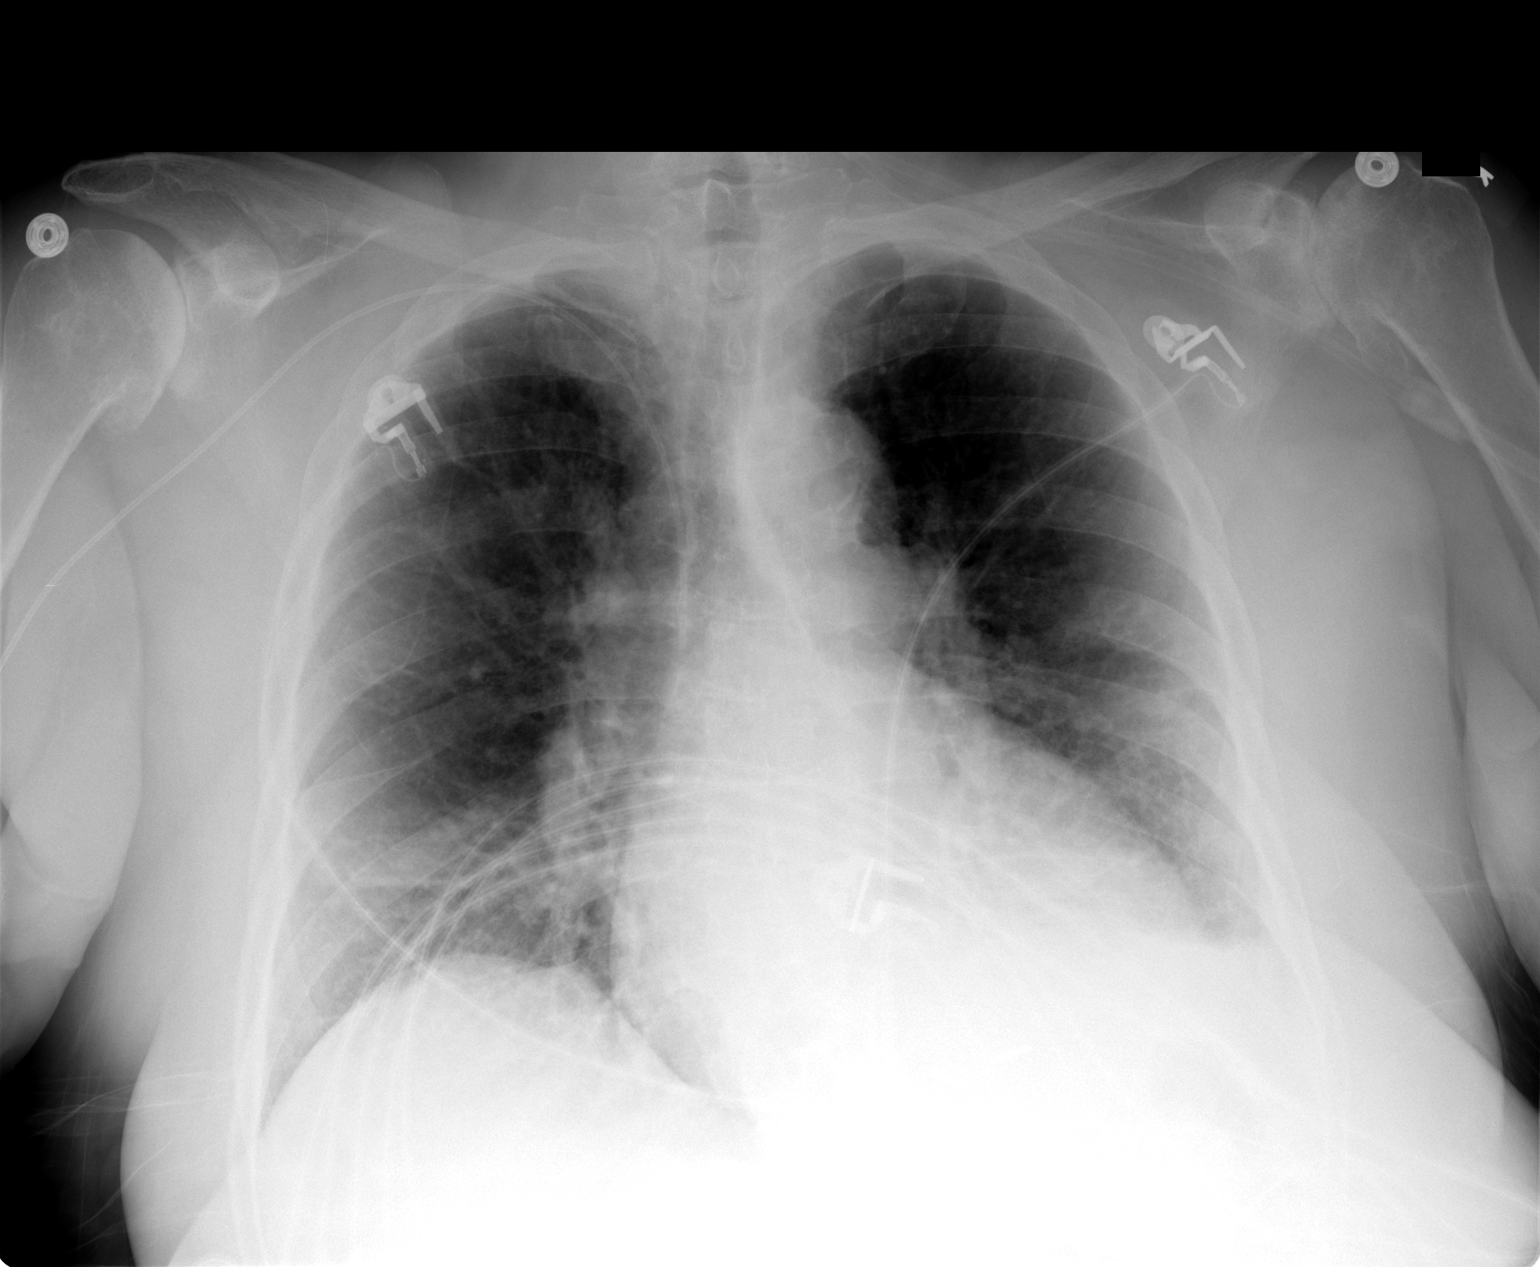

[1 of 1 positions shown; findings below may reference images not displayed]

FINDINGS: Right-sided PICC line has been inserted and the tip is in the superior vena cava with the tip just below the level of the carina.  Atelectasis at the right base has improved.  Persistent mild atelectasis at the left base.  There is mild pulmonary vascular prominence, unchanged.
IMPRESSION: PICC line in good position just below the level of the carina.  Improving atelectasis on the right.

## 2007-12-16 ENCOUNTER — Encounter: Payer: Self-pay | Admitting: Cardiology

## 2008-07-15 ENCOUNTER — Encounter (INDEPENDENT_AMBULATORY_CARE_PROVIDER_SITE_OTHER): Payer: Self-pay | Admitting: Internal Medicine

## 2009-01-23 ENCOUNTER — Encounter: Payer: Self-pay | Admitting: Cardiology

## 2009-02-06 ENCOUNTER — Encounter: Payer: Self-pay | Admitting: Cardiology

## 2009-03-03 ENCOUNTER — Encounter: Payer: Self-pay | Admitting: Cardiology

## 2009-03-07 ENCOUNTER — Encounter (INDEPENDENT_AMBULATORY_CARE_PROVIDER_SITE_OTHER): Payer: Self-pay | Admitting: *Deleted

## 2009-03-15 ENCOUNTER — Ambulatory Visit: Payer: Self-pay | Admitting: Cardiology

## 2009-03-15 DIAGNOSIS — R0602 Shortness of breath: Secondary | ICD-10-CM | POA: Insufficient documentation

## 2009-03-22 ENCOUNTER — Ambulatory Visit: Payer: Self-pay | Admitting: Cardiology

## 2009-03-23 ENCOUNTER — Ambulatory Visit: Payer: Self-pay | Admitting: Cardiology

## 2009-03-30 ENCOUNTER — Ambulatory Visit: Payer: Self-pay | Admitting: Cardiovascular Disease

## 2009-03-30 ENCOUNTER — Inpatient Hospital Stay (HOSPITAL_COMMUNITY): Admission: AD | Admit: 2009-03-30 | Discharge: 2009-04-03 | Payer: Self-pay | Admitting: Cardiology

## 2009-03-30 ENCOUNTER — Ambulatory Visit: Payer: Self-pay | Admitting: Cardiology

## 2009-03-30 ENCOUNTER — Inpatient Hospital Stay (HOSPITAL_BASED_OUTPATIENT_CLINIC_OR_DEPARTMENT_OTHER): Admission: RE | Admit: 2009-03-30 | Discharge: 2009-03-30 | Payer: Self-pay | Admitting: Cardiology

## 2009-03-31 ENCOUNTER — Encounter: Payer: Self-pay | Admitting: Cardiology

## 2009-04-03 ENCOUNTER — Encounter: Payer: Self-pay | Admitting: Cardiology

## 2009-04-19 ENCOUNTER — Encounter: Payer: Self-pay | Admitting: Cardiology

## 2009-04-20 ENCOUNTER — Ambulatory Visit: Payer: Self-pay | Admitting: Cardiology

## 2009-04-24 ENCOUNTER — Encounter: Payer: Self-pay | Admitting: Cardiology

## 2009-05-05 ENCOUNTER — Ambulatory Visit: Payer: Self-pay | Admitting: Cardiology

## 2009-05-19 ENCOUNTER — Encounter: Payer: Self-pay | Admitting: Cardiology

## 2009-05-22 ENCOUNTER — Encounter: Payer: Self-pay | Admitting: Cardiology

## 2009-05-23 ENCOUNTER — Encounter: Payer: Self-pay | Admitting: Cardiology

## 2009-05-25 ENCOUNTER — Encounter: Payer: Self-pay | Admitting: Cardiology

## 2009-07-03 ENCOUNTER — Telehealth (INDEPENDENT_AMBULATORY_CARE_PROVIDER_SITE_OTHER): Payer: Self-pay | Admitting: *Deleted

## 2009-07-13 ENCOUNTER — Ambulatory Visit: Payer: Self-pay | Admitting: Cardiology

## 2009-07-13 DIAGNOSIS — I2510 Atherosclerotic heart disease of native coronary artery without angina pectoris: Secondary | ICD-10-CM | POA: Insufficient documentation

## 2009-07-13 DIAGNOSIS — I359 Nonrheumatic aortic valve disorder, unspecified: Secondary | ICD-10-CM | POA: Insufficient documentation

## 2009-08-29 ENCOUNTER — Encounter: Payer: Self-pay | Admitting: Cardiology

## 2009-10-26 ENCOUNTER — Encounter: Payer: Self-pay | Admitting: Cardiology

## 2010-01-04 ENCOUNTER — Encounter: Payer: Self-pay | Admitting: Cardiology

## 2010-02-02 ENCOUNTER — Encounter: Payer: Self-pay | Admitting: Physician Assistant

## 2010-02-09 ENCOUNTER — Ambulatory Visit: Payer: Self-pay | Admitting: Cardiology

## 2010-02-09 ENCOUNTER — Encounter: Payer: Self-pay | Admitting: Physician Assistant

## 2010-02-14 ENCOUNTER — Encounter (INDEPENDENT_AMBULATORY_CARE_PROVIDER_SITE_OTHER): Payer: Self-pay | Admitting: *Deleted

## 2010-02-16 ENCOUNTER — Encounter: Payer: Self-pay | Admitting: Cardiology

## 2010-02-16 ENCOUNTER — Ambulatory Visit: Payer: Self-pay | Admitting: Physician Assistant

## 2010-02-16 DIAGNOSIS — E785 Hyperlipidemia, unspecified: Secondary | ICD-10-CM | POA: Insufficient documentation

## 2010-02-20 ENCOUNTER — Encounter: Payer: Self-pay | Admitting: Cardiology

## 2010-03-22 ENCOUNTER — Ambulatory Visit (HOSPITAL_COMMUNITY): Admission: RE | Admit: 2010-03-22 | Discharge: 2010-03-23 | Payer: Self-pay | Admitting: Ophthalmology

## 2010-04-24 ENCOUNTER — Encounter: Payer: Self-pay | Admitting: Cardiology

## 2010-08-01 ENCOUNTER — Inpatient Hospital Stay (HOSPITAL_COMMUNITY)
Admission: RE | Admit: 2010-08-01 | Discharge: 2010-08-02 | Payer: Self-pay | Source: Home / Self Care | Attending: Ophthalmology | Admitting: Ophthalmology

## 2010-08-01 LAB — CBC
HCT: 36.2 % (ref 36.0–46.0)
Hemoglobin: 11.5 g/dL — ABNORMAL LOW (ref 12.0–15.0)
MCHC: 31.8 g/dL (ref 30.0–36.0)
MCV: 110.4 fL — ABNORMAL HIGH (ref 78.0–100.0)
WBC: 7.5 10*3/uL (ref 4.0–10.5)

## 2010-08-01 LAB — BASIC METABOLIC PANEL
BUN: 7 mg/dL (ref 6–23)
CO2: 32 mEq/L (ref 19–32)
Glucose, Bld: 133 mg/dL — ABNORMAL HIGH (ref 70–99)
Potassium: 4.7 mEq/L (ref 3.5–5.1)
Sodium: 144 mEq/L (ref 135–145)

## 2010-08-01 LAB — SURGICAL PCR SCREEN: MRSA, PCR: NEGATIVE

## 2010-08-01 LAB — GLUCOSE, CAPILLARY: Glucose-Capillary: 120 mg/dL — ABNORMAL HIGH (ref 70–99)

## 2010-08-02 LAB — GLUCOSE, CAPILLARY
Glucose-Capillary: 126 mg/dL — ABNORMAL HIGH (ref 70–99)
Glucose-Capillary: 146 mg/dL — ABNORMAL HIGH (ref 70–99)

## 2010-08-07 NOTE — Letter (Signed)
Summary: External Correspondence/ [facility]  External Correspondence/ [facility]   Imported By: Dorise Hiss 09/04/2009 16:51:45  _____________________________________________________________________  External Attachment:    Type:   Image     Comment:   External Document

## 2010-08-07 NOTE — Letter (Signed)
Summary: Letter/ faxed medical clearance to [facility]  Letter/ faxed medical clearance to [facility]   Imported By: Dorise Hiss 02/16/2010 12:36:32  _____________________________________________________________________  External Attachment:    Type:   Image     Comment:   External Document

## 2010-08-07 NOTE — Letter (Signed)
Summary: External Correspondence/ [facility] NOTE  External Correspondence/ [facility] NOTE   Imported By: Dorise Hiss 01/09/2010 11:59:58  _____________________________________________________________________  External Attachment:    Type:   Image     Comment:   External Document

## 2010-08-07 NOTE — Letter (Signed)
Summary: Engineer, materials at [facility]  [address]   [place], [place] [address]   Phone: [phone]  Fax: [phone]        February 14, 2010 MRN: [id number]   [name] [address] [place], [place]  [address]   Dear Ms. Simoneaux,  Your test ordered by Selena Batten has been reviewed by your physician (or physician assistant) and was found to be normal or stable. Your physician (or physician assistant) felt no changes were needed at this time.  __X__ Echocardiogram  ____ Cardiac Stress Test  ____ Lab Work  ____ Peripheral vascular study of arms, legs or neck  ____ CT scan or X-ray  ____ Lung or Breathing test  ____ Other:   Thank you.   Hoover Brunette, LPN    Duane Boston, M.D., F.A.C.C. Thressa Sheller, M.D., F.A.C.C. Oneal Grout, M.D., F.A.C.C. Cheree Ditto, M.D., F.A.C.C. Daiva Nakayama, M.D., F.A.C.C. Kenney Houseman, M.D., F.A.C.C. Jeanne Ivan, PA-C

## 2010-08-07 NOTE — Letter (Signed)
Summary: [facility] - OFFICE NOTE  [facility] - OFFICE NOTE   Imported By: Claudette Laws 10/31/2009 08:23:06  _____________________________________________________________________  External Attachment:    Type:   Image     Comment:   External Document

## 2010-08-07 NOTE — Letter (Signed)
Summary: External Correspondence/ [facility]  External Correspondence/ [facility]   Imported By: Dorise Hiss 04/30/2010 15:44:03  _____________________________________________________________________  External Attachment:    Type:   Image     Comment:   External Document

## 2010-08-07 NOTE — Miscellaneous (Signed)
Summary: Orders Update  Clinical Lists Changes  Orders: Added new Referral order of 2-D Echocardiogram (2D Echo) - Signed 

## 2010-08-07 NOTE — Letter (Signed)
Summary: External Correspondence/ [facility]  External Correspondence/ [facility]   Imported By: Dorise Hiss 02/21/2010 11:17:39  _____________________________________________________________________  External Attachment:    Type:   Image     Comment:   External Document

## 2010-08-07 NOTE — Assessment & Plan Note (Signed)
Summary: 6 wk fu -srs   Visit Type:  Follow-up Primary Provider:  Ernestine Conrad, MD  CC:  follow-up visit.  History of Present Illness:  the patient is a 76 year old female with multiple medical problems.  The patient has known aortic stenosis, nonobstructive coronary artery disease by recent catheterization.  As well as peripheral vascular disease.however, her pain in the lower extremities appear more consistent with neuropathic pain. They occur both at rest and exertion.  The patient has difficulty walking and uses a walker.  She has significant peripheral vascular disease in the right leg but reports the same amount of pain in the left leg.  She also has complaints of left arm pain recently after a flu shot.  She is reporting shortness of breath and symptoms of bronchitis.  She denies currently any chest pain.  She has no orthopnea, PND or palpitations.  Today we reviewed her CT angiogram of the abdomen pelvis and lower extremity runoff.in particular, her right SFA appears to be occluded.  The left SFA has moderate disease.  There is no obvious iliofemoral disease.  Clinical Review Panels:  Cardiac Imaging Cardiac Cath Findings IMPRESSION:  Nonobstructive disease in the mid left anterior descending as well as the proximal left anterior descending. (04/07/2009)    Preventive Screening-Counseling & Management  Alcohol-Tobacco     Smoking Status: quit     Year Quit: 03/2009  Current Problems (verified): 1)  Guaiac Positive Stool  () 2)  Shortness of Breath  (ICD-786.05) 3)  CHF  (ICD-428.0) 4)  Pvd With Claudication  (ICD-443.89) 5)  Diabetes Mellitus, Type II  (ICD-250.00) 6)  Leg Pain  (ICD-729.5) 7)  Wound Open, Hand w/o Complication  (ICD-882.0) 8)  Splenectomy, Hx of  (ICD-V45.79) 9)  Hyperglycemia  (ICD-790.6) 10)  Cellulitis/abscess, Hand  (ICD-682.4) 11)  Congestive Heart Failure  (ICD-428.0) 12)  Respiratory Failure, Acute  (ICD-518.81) 13)  Ulcer, Acute Gastric W/hem+perf  w/o Obst  (ICD-531.20) 14)  Hx, Personal, Past Noncompliance  (ICD-V15.81) 15)  Psychosocial Problem  (ICD-V62.9) 16)  Tobacco Abuse  (ICD-305.1) 17)  Chest Pain, Atypical  (ICD-786.59) 18)  Nausea and Vomiting  (ICD-787.01) 19)  Dizziness  (ICD-780.4) 20)  Carotid Bruit, Right  (ICD-785.9) 21)  Gastrointestinal Hemorrhage, Hx of  (ICD-V12.79) 22)  Constipation, Chronic  (ICD-564.09) 23)  Arthritis  (ICD-716.90) 24)  Demylinating Disorder  () 25)  Peripheral Vascular Disease  (ICD-443.9) 26)  Peptic Ulcer Disease  (ICD-533.90) 27)  Osteoarthritis  (ICD-715.90) 28)  Peripheral Neuropathy  (ICD-356.9) 29)  Myocardial Infarction, Hx of  (ICD-412) 30)  Hypertension  (ICD-401.9) 31)  Depression  (ICD-311) 32)  COPD  (ICD-496) 33)  Anxiety  (ICD-300.00)  Current Medications (verified): 1)  Multivitamins  Tabs (Multiple Vitamin) .Marland Kitchen.. 1 By Mouth Once Daily 2)  Benazepril Hcl 20 Mg Tabs (Benazepril Hcl) .Marland Kitchen.. 1 By Mouth Once Daily 3)  Lopressor 100 Mg Tabs (Metoprolol Tartrate) .Marland Kitchen.. 1 By Mouth Two Times A Day 4)  Spiriva Handihaler 18 Mcg Caps (Tiotropium Bromide Monohydrate) .Marland Kitchen.. 1 Puff Once Daily 5)  Nitroquick 0.4 Mg Subl (Nitroglycerin) .... As Directed 6)  Miralax  Powd (Polyethylene Glycol 3350) .Marland Kitchen.. 1 By Mouth Two Times A Day 7)  Nicotine Polacrilex 4 Mg Gum (Nicotine Polacrilex) .Marland Kitchen.. 1 Piece of Gum Every Hour As Needed Cravings For Cigarette For 2 Weeks Then 1 Piece of Gum Every 2 Hours For 2 Weeks As Needed Cigarette Craving. 8)  Metformin Hcl 500 Mg Tabs (Metformin Hcl) .... Take 1 Tablet  By Mouth Two Times A Day 9)  Furosemide 20 Mg Tabs (Furosemide) .Marland Kitchen.. 1 By Mouth Once Daily 10)  Mirtazapine 30 Mg  Tabs (Mirtazapine) .Marland Kitchen.. 1 By Mouth At Bedtime 11)  Potassium Chloride Crys Cr 20 Meq  Tbcr (Potassium Chloride Crys Cr) .Marland Kitchen.. 1 By Mouth Once Daily 12)  O2 2l .... At Bedtime 13)  Lortab 10 10-500 Mg Tabs (Hydrocodone-Acetaminophen) .... Every 6 Hours As Needed Pain 14)  Reglan 5 Mg  Tabs (Metoclopramide Hcl) .... Take By Mouth Three Times A Day 15)  Paxil 10 Mg Tabs (Paroxetine Hcl) .... Take 1 Tablet By Mouth Once A Day 16)  Triamcinolone Acetonide 0.1 % Crea (Triamcinolone Acetonide) .... Apply To Arm Once Daily 17)  Valium 5 Mg Tabs (Diazepam) .... Take 1 Tablet By Mouth Two Times A Day As Needed Anxiety 18)  Lumigan 0.03 % Soln (Bimatoprost) .... Apply Every Evening To Each Affected  Eye 19)  Amlodipine Besylate 5 Mg Tabs (Amlodipine Besylate) .... Take One Tablet By Mouth Daily 20)  Triamcinolone Acetonide 0.1 % Crea (Triamcinolone Acetonide) .... Apply To Arm Daily 21)  Alphagan P 0.15 % Soln (Brimonidine Tartrate) .... One Gtt Into (L) Eye Two Times A Day 22)  Plavix 75 Mg Tabs (Clopidogrel Bisulfate) .... Take 1 Tablet By Mouth Once A Day 23)  Simvastatin 40 Mg Tabs (Simvastatin) .... Take One Tablet By Mouth Daily At Bedtime 24)  Docusate Sodium 100 Mg Caps (Docusate Sodium) .... Take 1 Capsule By Mouth Once A Day As Needed 25)  Ferrous Sulfate 325 (65 Fe) Mg Tabs (Ferrous Sulfate) .... Take 1 Tablet By Mouth Once A Day 26)  Aspirin 81 Mg Tbec (Aspirin) .... Take One Tablet By Mouth Daily 27)  Gabapentin 600 Mg Tabs (Gabapentin) .... Take 1 Tablet By Mouth Three Times A Day  (Place On File)  Allergies (verified): 1)  Aspirin 2)  Penicillin  Comments:  Nurse/Medical Assistant: The patient is currently on medications but does not know the name or dosage at this time. Instructed to contact our office with details. Will update medication list at that time.  Past History:  Past Medical History: Last updated: 04/19/2009 Anxiety COPD Depression Hypertension Peripheral neuropathy Osteoarthritis Peptic ulcer disease Peripheral vascular disease demyelinating disorder arthritis chronic constipation iron deficiency anemia Gi bleed, hx Gastric ulcer Multifocal atrial tachycardia Congestive heart failure hyperglycemia Diabetes mellitus, type II Dyspnea  on exertion... cardiac catheterization September, 2010... elevated right heart pressures... question LAD lesion but Ivus showed no significant stenosis EF.  60%..... catheterization... September of 2010 Myocardial infarction... by history and past  Past Surgical History: Last updated: 11/25/2006 Cholecystectomy Partial gastrectomy splenectomy  Family History: Last updated: Sep 24, 2006 father-deceased mother-deceased sisters x3 brothers x3   children-    female-deceased-kidneys    female-    female- x5  Social History: Last updated: 02/19/2007 Former smoker-1/2ppd for 54 years, quit 5/08 Alcohol use-no Drug use-no widowed  Risk Factors: Exercise: no (09/24/06)  Risk Factors: Smoking Status: quit (07/13/2009) Packs/Day: 0.5-1 (05/28/2006) Passive Smoke Exposure: no (Sep 24, 2006)  Review of Systems       The patient complains of fatigue, shortness of breath, and muscle weakness.  The patient denies malaise, fever, weight gain/loss, vision loss, decreased hearing, hoarseness, chest pain, palpitations, prolonged cough, wheezing, sleep apnea, coughing up blood, abdominal pain, blood in stool, nausea, vomiting, diarrhea, heartburn, incontinence, blood in urine, joint pain, leg swelling, rash, skin lesions, headache, fainting, dizziness, depression, anxiety, enlarged lymph nodes, easy bruising or bleeding, and environmental  allergies.         leg pain bilaterally at rest and on exertion  Vital Signs:  Patient profile:   76 year old female Height:      64 inches Weight:      183 pounds Pulse rate:   93 / minute BP sitting:   151 / 84  (left arm) Cuff size:   large  Vitals Entered By: Carlye Grippe (July 13, 2009 11:01 AM) CC: follow-up visit   Physical Exam  Additional Exam:  General: Well-developed, well-nourished in no distress head: Normocephalic and atraumatic eyes PERRLA/EOMI intact, conjunctiva and lids normal nose: No deformity or lesions mouth normal  dentition, normal posterior pharynx neck: Supple, no JVD.  No masses, thyromegaly or abnormal cervical nodes lungs: Normal breath sounds bilaterally without wheezing.  Normal percussion heart: regular rate and rhythm with normal S1 and S2, no S3 or S4.  PMI is normal.  2/6 crescendo decrescendo murmur at the right upper sternal border. abdomen: Normal bowel sounds, abdomen is soft and nontender without masses, organomegaly or hernias noted.  No hepatosplenomegaly musculoskeletal: Back normal, difficulty with gait using a walker.  Decreased muscle strength and tone. pulsus:1+ dorsalis pedis and posterior tibial pulses bilaterally Extremities: No peripheral pitting edema neurologic: Alert and oriented x 3 skin: Intact without lesions or rashes cervical nodes: No significant adenopathy psychologic: Normal affect    Impression & Recommendations:  Problem # 1:  PVD WITH CLAUDICATION (ICD-443.89) I discussed the findings with Dr. Excell Seltzer.  Given the fact that the patient has bilateral lower extremity pain at rest and her symptoms are more consistent with neuropathic pain a conservative approach will be taken.  I did increase the patient's gabapentin to 600 mg p.o. t.i.d. she has a multitude of medications.  I will not add Pletal this point in time.  The patient is not extremely ambulatoryi and uses her walker.  pain control and a is most important at this point in time  Problem # 2:  DIABETES MELLITUS, TYPE II (ICD-250.00) Assessment: Comment Only  Her updated medication list for this problem includes:    Benazepril Hcl 20 Mg Tabs (Benazepril hcl) .Marland Kitchen... 1 by mouth once daily    Metformin Hcl 500 Mg Tabs (Metformin hcl) .Marland Kitchen... Take 1 tablet by mouth two times a day    Aspirin 81 Mg Tbec (Aspirin) .Marland Kitchen... Take one tablet by mouth daily  Problem # 3:  LEG PAIN (ICD-729.5) see outlined above  Problem # 4:  AORTIC VALVE DISORDERS (ICD-424.1) the patient's aortic stenosis does not appear to be  symptomatic at this point in time.  However she will be scheduled for a follow-up echocardiogram in 6 months. Her updated medication list for this problem includes:    Benazepril Hcl 20 Mg Tabs (Benazepril hcl) .Marland Kitchen... 1 by mouth once daily    Lopressor 100 Mg Tabs (Metoprolol tartrate) .Marland Kitchen... 1 by mouth two times a day    Nitroquick 0.4 Mg Subl (Nitroglycerin) .Marland Kitchen... As directed    Furosemide 20 Mg Tabs (Furosemide) .Marland Kitchen... 1 by mouth once daily  Problem # 5:  CAD (ICD-414.00) the patient has no obstructive coronary artery disease.  We will continue aggressive risk factor modification.  No further ischemia testing is required at the present time.  The patient also does not report substernal chest pain.  I discussed therapeutic lifestyle modification Her updated medication list for this problem includes:    Benazepril Hcl 20 Mg Tabs (Benazepril hcl) .Marland Kitchen... 1 by mouth once  daily    Lopressor 100 Mg Tabs (Metoprolol tartrate) .Marland Kitchen... 1 by mouth two times a day    Nitroquick 0.4 Mg Subl (Nitroglycerin) .Marland Kitchen... As directed    Amlodipine Besylate 5 Mg Tabs (Amlodipine besylate) .Marland Kitchen... Take one tablet by mouth daily    Plavix 75 Mg Tabs (Clopidogrel bisulfate) .Marland Kitchen... Take 1 tablet by mouth once a day    Aspirin 81 Mg Tbec (Aspirin) .Marland Kitchen... Take one tablet by mouth daily  Patient Instructions: 1)  Increase Gabapentin to 600mg  three times a day - future refills from PMD. 2)  See Dr. Loney Hering for shoulder 3)  echo in 6 months (just before next visit) 4)  Follow up in  6 months.   Prescriptions: GABAPENTIN 600 MG TABS (GABAPENTIN) Take 1 tablet by mouth three times a day  (PLACE ON FILE)  #90 x 1   Entered by:   Hoover Brunette, LPN   Authorized by:   Lewayne Bunting, MD, [facility]   Signed by:   Hoover Brunette, LPN on 78/29/5621   Method used:   Electronically to        [facility]* (retail)       [address]       [place], [place]  [address]       Ph: [phone]       Fax: [phone]   RxID:    [id number]

## 2010-08-07 NOTE — Assessment & Plan Note (Signed)
Summary: 6 MO FU PER JULY REMINDER-SRS   Visit Type:  Follow-up Primary Provider:  Ernestine Conrad, MD   History of Present Illness: patient presents for scheduled followup, last seen here in January, by Dr. Andee Lineman.   Since then, she is awaiting clearance to undergo cataract surgery of the right eye, having had left eye surgery approximately 2 years ago.  Clinically, she denies any significant change from her baseline: she continues to experienced near daily chest pain, both with and without activity, for which she takes an occasional nitroglycerin tablet, with reported relief. Of note, however, she has no history of significant CAD.  Patient hasn't stop smoking tobacco. She has also lost 23 pounds.  Preventive Screening-Counseling & Management  Alcohol-Tobacco     Smoking Status: quit     Year Quit: 12/2009  Current Medications (verified): 1)  Multivitamins  Tabs (Multiple Vitamin) .Marland Kitchen.. 1 By Mouth Once Daily 2)  Benazepril Hcl 20 Mg Tabs (Benazepril Hcl) .Marland Kitchen.. 1 By Mouth Once Daily 3)  Lopressor 100 Mg Tabs (Metoprolol Tartrate) .Marland Kitchen.. 1 By Mouth Two Times A Day 4)  Spiriva Handihaler 18 Mcg Caps (Tiotropium Bromide Monohydrate) .Marland Kitchen.. 1 Puff Once Daily 5)  Nitroquick 0.4 Mg Subl (Nitroglycerin) .... As Directed 6)  Miralax  Powd (Polyethylene Glycol 3350) .Marland Kitchen.. 1 By Mouth Two Times A Day 7)  Nicotine Polacrilex 4 Mg Gum (Nicotine Polacrilex) .Marland Kitchen.. 1 Piece of Gum Every Hour As Needed Cravings For Cigarette For 2 Weeks Then 1 Piece of Gum Every 2 Hours For 2 Weeks As Needed Cigarette Craving. 8)  Metformin Hcl 500 Mg Tabs (Metformin Hcl) .... Take 1 Tablet By Mouth Two Times A Day 9)  Furosemide 40 Mg Tabs (Furosemide) .... Take 1 Tablet By Mouth Once A Day 10)  Mirtazapine 30 Mg  Tabs (Mirtazapine) .Marland Kitchen.. 1 By Mouth At Bedtime 11)  Potassium Chloride Crys Cr 20 Meq  Tbcr (Potassium Chloride Crys Cr) .Marland Kitchen.. 1 By Mouth Once Daily 12)  O2 2l .... At Bedtime 13)  Lortab 10 10-500 Mg Tabs  (Hydrocodone-Acetaminophen) .... Every 6 Hours As Needed Pain 14)  Reglan 5 Mg Tabs (Metoclopramide Hcl) .... Take By Mouth Three Times A Day 15)  Paxil 10 Mg Tabs (Paroxetine Hcl) .... Take 1 Tablet By Mouth Once A Day 16)  Triamcinolone Acetonide 0.1 % Crea (Triamcinolone Acetonide) .... Apply To Arm Once Daily 17)  Valium 5 Mg Tabs (Diazepam) .... Take 1 Tablet By Mouth Two Times A Day As Needed Anxiety 18)  Triamcinolone Acetonide 0.1 % Crea (Triamcinolone Acetonide) .... Apply To Arm Daily 19)  Alphagan P 0.15 % Soln (Brimonidine Tartrate) .... One Gtt Into (R) Eye Two Times A Day 20)  Simvastatin 40 Mg Tabs (Simvastatin) .... Take One Tablet By Mouth Daily At Bedtime 21)  Docusate Sodium 100 Mg Caps (Docusate Sodium) .... Take 1 Capsule By Mouth Once A Day As Needed 22)  Ferrous Sulfate 325 (65 Fe) Mg Tabs (Ferrous Sulfate) .... Take 1 Tablet By Mouth Once A Day 23)  Aspirin 81 Mg Tbec (Aspirin) .... Take One Tablet By Mouth Daily 24)  Gabapentin 600 Mg Tabs (Gabapentin) .... Take 1 Tablet By Mouth Three Times A Day  (Place On File) 25)  Diltiazem Hcl Er Beads 180 Mg Xr24h-Cap (Diltiazem Hcl Er Beads) .... Take 1 Tablet By Mouth Once A Day 26)  Hydroxyurea 500 Mg Caps (Hydroxyurea) .... Take 1 Tablet By Mouth Once A Day 27)  Glipizide 10 Mg Tabs (Glipizide) .... Take 1  Tablet By Mouth Two Times A Day 28)  Lotemax 0.5 % Susp (Loteprednol Etabonate) .... One Drop To Od Four Times A Day 29)  Ketorlac 0.4% .... One Drop Od Four Times A Day 30)  Dorzolamide Hcl 2 % Soln (Dorzolamide Hcl) .... One Drop To Os Two Times A Day 31)  Ciprofloxacin Hcl 0.3 % Soln (Ciprofloxacin Hcl) .... One Drop Od Four Times A Day  Allergies (verified): 1)  Aspirin 2)  Penicillin  Comments:  Nurse/Medical Assistant: The patient's medication list and allergies were reviewed with the patient and were updated in the Medication and Allergy Lists.  Past History:  Past Medical History: Last updated:  04/19/2009 Anxiety COPD Depression Hypertension Peripheral neuropathy Osteoarthritis Peptic ulcer disease Peripheral vascular disease demyelinating disorder arthritis chronic constipation iron deficiency anemia Gi bleed, hx Gastric ulcer Multifocal atrial tachycardia Congestive heart failure hyperglycemia Diabetes mellitus, type II Dyspnea on exertion... cardiac catheterization September, 2010... elevated right heart pressures... question LAD lesion but Ivus showed no significant stenosis EF.  60%..... catheterization... September of 2010 Myocardial infarction... by history and past  Review of Systems       No fevers, chills, hemoptysis, dysphagia, melena, hematocheezia, hematuria, rash, claudication, orthopnea, or pnd. chronic stable DOE and lower extremity edema. All other systems negative.   Vital Signs:  Patient profile:   76 year old female Height:      64 inches Weight:      160 pounds BMI:     27.56 O2 Sat:      98 % on 2 L/min Pulse rate:   96 / minute BP sitting:   144 / 90  (left arm) Cuff size:   large  Vitals Entered By: Carlye Grippe (February 16, 2010 10:53 AM)  Nutrition Counseling: Patient's BMI is greater than 25 and therefore counseled on weight management options.  O2 Flow:  2 L/min   Physical Exam  Additional Exam:  GEN:75 yo female, obese, in no distress HEENT: NCAT,PERRLA,EOMI LUNGS: CTA bilaterally HEART: RRR (S1S2); no significant murmurs; no rubs; no gallops ABD: soft, NT; intact BS EXT: intact distal pulses; 1+ lower extremity edema SKIN: warm, dry MUSC: no obvious deformity NEURO: A/O (x3)     EKG  Procedure date:  02/16/2010  Findings:      sinus rhythm at 92 bpm; normal axis no ischemic changes  Impression & Recommendations:  Problem # 1:  CAD (ICD-414.00)  patient has nonobstructive CAD by cardiac catheterization, 10/10. Recent followup echo, reviewed by Dr. Andee Lineman, for reassessment of severity of known aortic  stenosis, indicated normal LVF (EF 55-60%), with no significant valvular abnormalities. No further cardiac testing indicated at this time, and patient is cleared to proceed with retinal surgery, as planned. She can stop aspirin 5 days prior to procedure, and resume it when okay with surgery. We'll schedule return clinic visit with Dr. Andee Lineman in 6 months.  Problem # 2:  PVD WITH CLAUDICATION (ICD-443.89) Assessment: Comment Only  Problem # 3:  TOBACCO ABUSE (ICD-305.1)  patient has since stopped smoking.  Problem # 4:  HYPERTENSION (ICD-401.9) Assessment: Comment Only  Problem # 5:  DYSLIPIDEMIA (ICD-272.4)  aggressive medical management recommended, with target LDL of 70 or less, if feasible.  Other Orders: EKG w/ Interpretation (93000)  Patient Instructions: 1)  Your physician wants you to follow-up in: 6 months. You will receive a reminder letter in the mail one-two months in advance. If you don't receive a letter, please call our office to schedule the follow-up appointment.  2)  You are cleared for surgery.

## 2010-09-20 LAB — BASIC METABOLIC PANEL
CO2: 31 mEq/L (ref 19–32)
Calcium: 9.5 mg/dL (ref 8.4–10.5)
GFR calc Af Amer: >60 mL/min (ref >60)
Potassium: 5.3 mEq/L — ABNORMAL HIGH (ref 3.5–5.1)
Sodium: 141 mEq/L (ref 135–145)

## 2010-09-20 LAB — GLUCOSE, CAPILLARY
Glucose-Capillary: 136 mg/dL — ABNORMAL HIGH (ref 70–99)
Glucose-Capillary: 141 mg/dL — ABNORMAL HIGH (ref 70–99)
Glucose-Capillary: 158 mg/dL — ABNORMAL HIGH (ref 70–99)
Glucose-Capillary: 246 mg/dL — ABNORMAL HIGH (ref 70–99)

## 2010-09-20 LAB — CBC
HCT: 34.8 % — ABNORMAL LOW (ref 36.0–46.0)
Hemoglobin: 10.8 g/dL — ABNORMAL LOW (ref 12.0–15.0)
MCH: 33.4 pg (ref 26.0–34.0)
RBC: 3.23 MIL/uL — ABNORMAL LOW (ref 3.87–5.11)

## 2010-09-20 LAB — SURGICAL PCR SCREEN
MRSA, PCR: NEGATIVE
Staphylococcus aureus: NEGATIVE

## 2010-10-08 NOTE — Op Note (Signed)
NAMEVERLA, [name]                ACCOUNT NO.:  [id number]  MEDICAL RECORD NO.:  [id number]          PATIENT TYPE:  INP  LOCATION:  5128                         FACILITY:  [facility]  PHYSICIAN:  Chalmers Guest, M.D.     DATE OF BIRTH:  1935-05-02  DATE OF PROCEDURE:  08/01/2010 DATE OF DISCHARGE:                              OPERATIVE REPORT   PREOPERATIVE DIAGNOSIS:  Uncontrolled glaucoma associated with ocular inflammation with pre-existing scarring, extensive ocular scarring from previous cataract surgery, previous retinal surgery.  PROCEDURE:  Ahmed implant with 2+ graft to reinforce the thin sclera and mitomycin-C.  COMPLICATIONS:  None.  ANESTHESIA:  2% Xylocaine With Epinephrine, 50/50 mixture of 0.75% Marcaine with an ampule of Wydase.  PROCEDURE IN DETAIL:  The patient was taken to the operative suite where a peribulbar block was given with the aforementioned local anesthetic agent.  Pressure was applied to the eye and then the patient's face was prepped and draped in the usual sterile fashion.  With the surgeon sitting superiorly, a 6-0 nylon suture was passed through clear cornea in two locations to infraduct the eye.  Following this with the eye in the infraductal position, it was noted that the eye was scarred from the 10 o'clock position clockwise to the 1 o'clock position.  Therefore, the superior temporal quadrant adjacent to the 10 o'clock position was chosen for an incision using the Westcott scissors and Hoskins forceps. The conjunctiva was slightly ballooned.  The externalization was carried at the limbus.  Blunt dissection was carried posteriorly.  There was bleeding, which was controlled with cautery and the tip blade was used to recess episclera on Tenon's fibers.  After extensive bleeding was controlled with cautery, mitomycin-C 0.4 mg/mL was placed on the eye on Gelfoam sponge posteriorly in the superior fornix, allowed to stay on the eye for 4 minutes and  then it was removed and irrigated with 60 mL of balanced salt solution.  At this point, the Ahmed graft was taken from its bag.  The Ahmed plate was SN number [id number], it was a model FT7 flexible plate.  It was primed using balanced salt solution through the shaft and fluid was seen to flow through the plate of the Ahmed.  The Ahmed was then placed with the eyelets 8 mm posterior to the corneoscleral limbus.  A 6-0 Mersilene suture on a spatula needle was used to secure the eyelets of the plate to the sclera.  Following this, the tube was trimmed, beveled up, and placed on the cornea.  At this point, a wheeler blade was used to enter the anterior chamber and release some of the high intraocular pressure that had been into the 30 and 40 mmHg prior to surgery.  Provisc was injected in the eye and then a 22 gauge needle was used near the superior limbus adjacent to the previous cataract surgery to enter the eye.  Provisc was injected.  The tube was then inserted in good position without touching the cornea or the iris.  It was noted that the cornea was extensively dry with epidural defects preoperatively.  Following this, a 9-0  nylon suture was used to suture the tube to the sclera.  Then the Tutoplast graft was used to reinforce the sclera, reference number [id number].  The Tutoplast was fashioned and then hydrated and sutured with two interrupted 9-0 nylon sutures.  Following this, the conjunctiva was then sutured using a 9-0 Vicryl suture achieving watertight closure.  BSS was then injected to remove the viscoelastic from the eye and decrease the intraocular pressure,  bleb elevated.  The incision areas were Seidel negative.  4 mg of Kenalog was injected subconjunctivally.  Topical TobraDex ointment, and a patch and Fox shield were placed to the eye.  The patient returned to the recovery area in stable condition.  Complications were none.     Chalmers Guest, M.D.     RW/MEDQ  D:   08/01/2010  T:  08/02/2010  Job:  [id number]  Electronically Signed by Chalmers Guest M.D. on 10/08/2010 01:54:48 PM

## 2010-10-12 LAB — GLUCOSE, CAPILLARY
Glucose-Capillary: 105 mg/dL — ABNORMAL HIGH (ref 70–99)
Glucose-Capillary: 106 mg/dL — ABNORMAL HIGH (ref 70–99)
Glucose-Capillary: 119 mg/dL — ABNORMAL HIGH (ref 70–99)
Glucose-Capillary: 124 mg/dL — ABNORMAL HIGH (ref 70–99)
Glucose-Capillary: 142 mg/dL — ABNORMAL HIGH (ref 70–99)
Glucose-Capillary: 145 mg/dL — ABNORMAL HIGH (ref 70–99)
Glucose-Capillary: 177 mg/dL — ABNORMAL HIGH (ref 70–99)

## 2010-10-12 LAB — CBC
HCT: 24.8 % — ABNORMAL LOW (ref 36.0–46.0)
HCT: 26.6 % — ABNORMAL LOW (ref 36.0–46.0)
HCT: 29.8 % — ABNORMAL LOW (ref 36.0–46.0)
Hemoglobin: 8.9 g/dL — ABNORMAL LOW (ref 12.0–15.0)
MCHC: 33.5 g/dL (ref 30.0–36.0)
MCV: 92.1 fL (ref 78.0–100.0)
Platelets: 463 10*3/uL — ABNORMAL HIGH (ref 150–400)
RBC: 2.69 MIL/uL — ABNORMAL LOW (ref 3.87–5.11)
RBC: 3.15 MIL/uL — ABNORMAL LOW (ref 3.87–5.11)
RDW: 14 % (ref 11.5–15.5)
WBC: 11.1 10*3/uL — ABNORMAL HIGH (ref 4.0–10.5)
WBC: 8.1 10*3/uL (ref 4.0–10.5)
WBC: 9.6 10*3/uL (ref 4.0–10.5)

## 2010-10-12 LAB — BRAIN NATRIURETIC PEPTIDE: Pro B Natriuretic peptide (BNP): 42 pg/mL (ref 0.0–100.0)

## 2010-10-12 LAB — BASIC METABOLIC PANEL
BUN: 13 mg/dL (ref 6–23)
Calcium: 8.5 mg/dL (ref 8.4–10.5)
Calcium: 8.6 mg/dL (ref 8.4–10.5)
Chloride: 105 mEq/L (ref 96–112)
Creatinine, Ser: 0.65 mg/dL (ref 0.4–1.2)
GFR calc Af Amer: >60 mL/min (ref >60)
GFR calc Af Amer: >60 mL/min (ref >60)
GFR calc non Af Amer: >60 mL/min (ref >60)
GFR calc non Af Amer: >60 mL/min (ref >60)
GFR calc non Af Amer: >60 mL/min (ref >60)
Glucose, Bld: 130 mg/dL — ABNORMAL HIGH (ref 70–99)
Potassium: 3.7 mEq/L (ref 3.5–5.1)
Potassium: 3.8 mEq/L (ref 3.5–5.1)
Potassium: 4.1 mEq/L (ref 3.5–5.1)
Sodium: 141 mEq/L (ref 135–145)
Sodium: 142 mEq/L (ref 135–145)

## 2010-10-12 LAB — CROSSMATCH

## 2010-10-12 LAB — POCT I-STAT 3, VENOUS BLOOD GAS (G3P V)
O2 Saturation: 54 %
TCO2: 30 mmol/L (ref 0–100)
pCO2, Ven: 47.9 mmHg (ref 45.0–50.0)
pO2, Ven: 29 mmHg — CL (ref 30.0–45.0)

## 2010-10-12 LAB — POCT I-STAT 3, ART BLOOD GAS (G3+)
Bicarbonate: 28.4 mEq/L — ABNORMAL HIGH (ref 20.0–24.0)
O2 Saturation: 93 %
pCO2 arterial: 43.2 mmHg (ref 35.0–45.0)
pO2, Arterial: 64 mmHg — ABNORMAL LOW (ref 80.0–100.0)

## 2010-10-12 LAB — MAGNESIUM: Magnesium: 2.5 mg/dL (ref 1.5–2.5)

## 2010-11-20 NOTE — Discharge Summary (Signed)
NAMESHAELYN, DECARLI                ACCOUNT NO.:  [id number]   MEDICAL RECORD NO.:  [id number]          PATIENT TYPE:  INP   LOCATION:  A215                          FACILITY:  [facility]   PHYSICIAN:  Marcello Moores, MD   DATE OF BIRTH:  1935/02/20   DATE OF ADMISSION:  11/02/2006  DATE OF DISCHARGE:  05/05/2008LH                               DISCHARGE SUMMARY   PRIMARY CARE PHYSICIAN:  Erle Crocker, M.D.   DISCHARGE DIAGNOSES:  1. Pneumonia.  She was treated with vancomycin and Levaquin and she      improved.  She will continue with Levaquin p.o. for 3 more days.  2. Acute dyspnea secondary to probably pneumonia, resolved.  3. Congestive heart failure with cardiomegaly, stable congestive heart      failure.  4. Thrombocytosis, probably reactive.  5. Leukocytosis, probably reactive and secondary to pneumonia as well.      Needs monitoring.  Currently, the white blood cell is 14,000.  6. Status post abdominal surgery for GI bleeding, clean wound, stable  7. Chronic obstructive pulmonary disease with exacerbation, resolving.      She will continue with prednisone tapering dose.  8. Hypertension, fairly controlled.  9. History of demyelinating disease.  10.Depression, stable.  11.History of cholecystectomy.  12.History of hemorrhoidectomy.  13.History of hiatal hernia repair recently.  14.Coronary artery disease.   HOME MEDICATIONS:  1. Levaquin 500 mg p.o. daily for 3 days.  2. Prednisone 60 mg to be tapered and stopped top within 2 weeks.  3. MiraLax 17 mg p.o. t.i.d.  4. Alprazolam 0.5 mg p.o. t.i.d.  5. Alphagan eye drops to the left eye twice a day.  6. Mirtazapine 30 mg p.o. at bedtime.  7. Metoprolol 100 mg p.o. b.i.d.  8. Omeprazole 10 mg p.o. daily.  9. Gabapentin 600 mg p.o. t.i.d.  10.Hydroxyzine 250 mg p.r.n.  11.Benazepril 20 mg p.o. daily.  12.Buspirone 30 mg p.o. b.i.d.  13.Ferrous sulfate 325 mg p.o. once a day.  14.Metoclopramide 5 mg p.r.n.  15.Cymbalta  60 mg p.o. b.i.d.  16.Spiriva 80 mcg handi-inhaler once a day.  17.Potassium chloride 20 mEq p.o. daily for 10 days.  18 home oxygen.   HOSPITAL COURSE:  Ms. Hai is a 76 year old woman with multiple  medical problems who presented with shortness of breath and cough.  She  was admitted with assessment of acute dyspnea secondary to pneumonia and  COPD exacerbation.  She was put on antibiotics as well as on Solu-Medrol  IV.  Over her hospital stay, she improved slowly and her vancomycin IV  was discontinued when she showed significant improvement; and Levaquin  was continued.  Today, she will be discharged was Levaquin p.o.  During  hospital stay, except for the admission problems, she had no new event;  but her platelet count was increasing and hematologist, Dr. Mariel Sleet,  was consulted to have broader evaluation and he felt that this is  probably reactive thrombocytosis related to her recent surgery; probably  partial splenectomy.   Today, she is doing fine.  She is saturating well without any oxygen and  she is breathing well.   PHYSICAL EXAMINATION:  VITAL SIGNS:  Today, temperature is 98, blood  pressure is 154/87, pulse is 74, respiratory rate is 19, saturation is  97%, which droped to 88 -92 while she is walking before left the floor  and home oxygen was given. her capillary blood glucose is 273, 290 and  340.  HEENT: She has pink conjunctivae, nonicteric sclerae.  CHEST:  Clear with good air entry bilaterally.  CVS: S1 and S2 well heard.  No murmur.  ABDOMEN: Soft, normoactive bowel sounds, very clean surgical wound with  clips.  EXTREMITIES:  She has very trace edema.  CNS: She is alert and well-oriented.   LABORATORY DATA:  White blood cell is 14, hemoglobin is 12.3, hematocrit  is 37, platelet count is 101,349.  On chemistry, sodium is 140 and  potassium is 3.3, chloride is 93, bicarb is 40, glucose is 264.   DISCHARGE PLAN:  The patient is clinically well-improved.   She has no  complaints currently and she is stable.  The only abnormality we have  during discharge is the elevated white blood cells and elevated  platelets which could be monitored as an outpatient by primary medical  doctor; and the second is elevated blood glucose which is most probably  secondary to Solu-Medrol and steroids and as we start to taper the  prednisone, probably it will resolve.  If not, treatment can be  considered.  I will communicate with Dr. Erle Crocker, her primary  medical doctor, as well about her situation and her followup.  home oxygen was given as her saturation drope d when she started move  around before she left the foor.   TIME FOR DISCHARGE:  45 minutes.      Marcello Moores, MD  Electronically Signed     MT/MEDQ  D:  11/10/2006  T:  11/10/2006  Job:  [id number]

## 2010-11-20 NOTE — Discharge Summary (Signed)
[name], [name]                ACCOUNT NO.:  [id number]   MEDICAL RECORD NO.:  [id number]          PATIENT TYPE:  INP   LOCATION:  5010                         FACILITY:  [facility]   PHYSICIAN:  Peggye Pitt, M.D. DATE OF BIRTH:  April 14, 1935   DATE OF ADMISSION:  04/30/2007  DATE OF DISCHARGE:  05/04/2007                               DISCHARGE SUMMARY   DISCHARGE DIAGNOSES:  1. 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  2. Hypertension.  3. Chronic obstructive pulmonary disease.  4. Type 2 diabetes.  5. Gastroesophageal reflux disease.  6. Glaucoma.  7. Coronary artery disease status post non-ST elevation myocardial      infarction in August 2008.  8. She also has a history of another episode of gastrointestinal      bleeding in April 2008 secondary to a gastric ulcer within a hiatal      hernia pouch that was repaired at [facility]      in April 2008.   DISCHARGE MEDICATIONS:  1. Protonix 40 mg p.o. b.i.d.  2. Clarithromycin 500 mg p.o. b.i.d. for 12 more days.  3. Metronidazole 500 mg p.o. b.i.d. for 12 more days.  4. Ferrous sulfate 325 mg p.o. b.i.d. with meals.  5. Spiriva 18 mcg one puff daily, preferably at bedtime.  6. Albuterol inhaler 2 puffs p.r.n. as needed for shortness of breath.  7. Ambien 5 mg p.o. nightly as needed for insomnia.  8. At the time of discharge, her antihypertensives including      benazepril, Lopressor and diltiazem have been placed on hold      secondary to borderline low BPs.   DISPOSITION AND FOLLOWUP:  The patient on followup with her primary care  physician, Dr. Tonette Lederer, in [place] on Wednesday, May 06, 2007, at  8:00 a.m.  At that time, a STAT CBC should be drawn to monitor her  hemoglobin as well as her blood pressure should be monitored to see if  restarting her antihypertensives will be appropriate at that  time.   CONSULTATIONS PERFORMED ON THIS EXAMINATION:  [facility], Dr. Lina Sar and Dr. Stan Head.   PROCEDURES PERFORMED DURING THIS HOSPITALIZATION AND IMAGING:  A chest x-  ray on April 30, 2007 with no acute abnormality.  An EGD performed by  Dr. Juanda Chance on May 01, 2007 that was consistent with one AVM in the  duodenal apex, nonbleeding.   HISTORY AND PHYSICAL EXAM:  For full details, please refer to the chart  but, in brief, [name] is a 76 year old African American woman who  had a history of a GI bleed back in April 2008 with workup that showed  gastric and small bowel AVMs who underwent repair of her hiatal hernia  in May 2008.  She was actually at short-stay on the morning of admission  awaiting an angiogram for evaluation of peripheral vascular disease were  her preop blood work showed a hemoglobin of 5.3, so she was transferred  to the  ED for further evaluation.  Upon further questioning, she has  black tarry stools that have really never ceased since April.  She had  been very fatigued and lightheaded recently and she also endorsed mild  chest pain and shortness of breath with exertion.  Vital signs upon  admission showed a temperature of 99.4, blood pressure of 99/51, heart  rate of 121, respirations 22 and O2 sats of 100% on room air.   LABORATORY UPON ADMISSION:  Sodium 143, potassium 3.7, chloride 107,  bicarb 27, BUN 17, creatinine 0.77, glucose of 157, bilirubin 0.4,  alkaline phosphatase 64, AST 19, ALT 14, protein 5.6, albumin 3.1 and  calcium of 8.6.  Her CBC had a white count of 10.3, hemoglobin of 5.39  (previously had been 12.3 in May 2008) with an MCV of 102 and platelets  of 632.  Her fecal occult blood testing was positive with very black  stools.  Her PTT was 27, PT 13.4 and INR 1.0.   HOSPITAL COURSE BY ACTIVE PROBLEM:  1. Acute GI bleed:  We immediately called GI upon her presentation      given that her tachycardia and hypotension could  be related to her      acute GI bleed.  We admitted her to the ICU.  We cycled her CBCs.      We placed two large bore IV sedation and typed and crossed her.      She was transfused a total of 4 units.  Her EGD showed gastric      AVMs.  She was on Plavix for coronary artery disease, so we have      decided at this time that the benefits of Plavix are probably      outweighed by her risk of GI bleeding since she has had two and she      had a previous capsule endoscopy on her other admission that showed      multiple small bowel AVMs.  Given the fact that she had a gastric      ulcer and had never been treated for H. pylori, we are treating      that currently with a PPI, clarithromycin and Flagyl and at the      time of discharge she has 12 more days of this, although I would      keep her on the Protonix indefinitely for now at least.  When she      follows up with her PCP, close followup of her hemoglobin will be      warranted.  She was given a dose of IV iron before discharge and      she will also receive ferrous sulfate 325 mg b.i.d.  2. Hypertension:  Given the fact that she was hypotensive upon      admission, we held all of her antihypertensives and her blood      pressure has not given [place] any room to restart them during this      hospitalization, so this is something else that her PCP needs to be      aware of, especially given the fact of her cardiomyopathy and CHF      that probably a beta blocker and an ACE inhibitor would be the      first things I would restart as soon as her blood pressure      tolerates.  3. COPD:  This was stable throughout this hospitalization as well as  all other chronic medical issues.   Her vital signs on the day of discharge revealed a temperature 99.3,  blood pressure 104/49, heart rate 60, respirations 20 and O2 sats 96% on  room air.   LABS ON DAY OF DISCHARGE:  Sodium 142, potassium 3.8, chloride 111,  bicarb 27, BUN 5, creatinine  0.71, glucose 103 and calcium 8.4.  WBCs  9.5, hemoglobin 9.9 and platelets 501.      Peggye Pitt, M.D.  Electronically Signed     EH/MEDQ  D:  05/04/2007  T:  05/05/2007  Job:  [id number]   cc:   Dr. Geryl Rankins. Juanda Chance, MD  Iva Boop, MD,FACG

## 2010-11-20 NOTE — Assessment & Plan Note (Signed)
[facility]                          [facility] OFF[facility] NOTE   NAME:[name], [name]                       MRN:          [id number]  DATE:12/18/2006                            DOB:          [birth year]    [name] is new to me.  She was seen in consultation by Dr. Andee Lineman at  [facility] on Nov 14, 2006.  There is an extensive note.  It was his  feeling that she had respiratory failure with congestive heart failure,  ischemic heart disease, and COPD, and possible pneumonia.  She had other  medical problems.  It was felt that she should be stabilized medically,  and then she should proceed for catheterization at [facility].  She refused  catheterization.  Dr. Andee Lineman saw her on Nov 18, 2006.  There was an  episode of what appeared to be multifocal atrial tachycardia.  This  stabilized.  She is treated with Cardizem, and she has been stable.  She  was discharged from the hospital at [facility] on Nov 19, 2006.  It was  felt that she had congestive heart failure.  She stabilized.  Her  tachycardia was stabilized.  She is seen back now, and still does not  want to proceed with catheterization.  Her diabetes was stabilized, and  her COPD was stabilized.  Overall, she stabilized.  Since leaving the  hospital, she has done well.  She has no complaints.  She has no chest  pain.  She is going about reasonable activities.   PAST MEDICAL HISTORY:   ALLERGIES:  1. PENICILLIN.  2. QUESTION OF ASPIRIN.   MEDICATIONS:  1. Diltiazem.  2. Gabapentin.  3. Buspirone 30 mg b.i.d.  4. Travatan eye drops.  5. Benazepril.  6. Alphagan eye drops.  7. Furosemide 20.  8. Omeprazole.  9. Potassium.  10.Oxygen.  11.Iron.   OTHER MEDICAL PROBLEMS:  See the list below.   REVIEW OF SYSTEMS:  Today, she feels well, and she has no significant  complaints.  Review of systems otherwise is negative.   PHYSICAL EXAMINATION:  Blood pressure is 130/84 with a pulse of 87.  Her  weight is 170 pounds.  The patient is oriented to person, time, and place.  Affect is normal.  She smells of smoke, although she says that she does not smoke, and the  other person in the room said they do not smoke.  HEENT:  Reveals no xanthelasma.  She has a left carotid bruit.  LUNGS:  Clear.  Respiratory effort is not labored.  CARDIAC:  Exam reveals an S1 with an S2.  There are no clicks or  significant murmurs.  ABDOMEN:  Obese, but soft.  She has trace peripheral edema in the left ankle.  No labs are obtained today.   The patient had a carotid Doppler that is dated Nov 14, 2006.  There was  no high-grade stenosis.  On the final result, the vessels are less than  50% stenosed.  The patient also had a 2-D echo while in the hospital  dated Nov 13, 2006.  Ejection fraction was  65%.  There was mild left  ventricular hypertrophy.  There was a small gradient across the aortic  outflow tract due to cordal __________ .  There was normal right  ventricular function.   PROBLEMS:  Include:  1. History of surgery for perforated gastric ulcer in the past several      months, and cholecystectomy.  2. Hospitalization recently at [facility] for pneumonia.  3. Recurrent hospitalization with admission on Nov 13, 2006 at      [facility].  4. Recent congestive heart failure with elevated BNP.  She was treated      with Lasix and BiPAP, and improved.  This was felt most likely to      be related to diastolic function, as her systolic function was      good.  5. Tachycardia.  She appears to have some multifocal atrial      tachycardia, and this is now under control.  6. Coronary artery disease.  She did have a rise in troponins.  At      some point, diagnostic catheterization would be appropriate.  She      is still not interested.  With normal left ventricular function,      she can be watched very carefully for symptoms.  7. Diabetes.  8. Significant chronic obstructive  pulmonary disease.  She is on home      oxygen.  9. History of hypertension.  10.Gastroesophageal reflux disease.  11.Recent pneumonia.   Cardiac status is stable.  We will see her back in 3 months for  followup.  She will call us if she any significant recurring chest pain.     Luis Abed, MD, [facility]  Electronically Signed    JDK/MedQ  DD: 12/18/2006  DT: 12/18/2006  Job #: [id number]   cc:   Erle Crocker, M.D.

## 2010-11-20 NOTE — Assessment & Plan Note (Signed)
OFFICE VISIT   [name], [name]  DOB:  June 07, 1935                                       04/20/2007  JXBJY#:[id number]   DATE OF VISIT:  04/20/2007   REASON FOR VISIT:  Evaluate leg pain.   REFERRING PHYSICIAN:  Denny Peon. Ulice Brilliant, D.P.M.  Dr. Charlynne Pander   HISTORY:  This is a 76 year old African American female seen at the  request of Dr. Adam Phenix for evaluation of bilateral lower extremity  pain.  The patient comes in with duplex ultrasound.  The patient reports  that for approximately the last 2-3 years she is having increasing  severity in the pain in her legs.  She says that it starts in her feet  and is present all the time.  She also complains that with ambulation  she gets numbness in her calves.  She denies having any ulcers or non-  healing wounds.  She denies history of trauma.   REVIEW OF SYSTEMS:  General:  No weight gain or weight loss.  Cardiac:  Positive for chest pain with ambulation.  Pulmonary:  No shortness of  breath.  GI:  History of bloody stools, positive for constipation.  GU:  Negative.  Vascular:  Please see above in the HPI.  Neuro:  Negative.  Psychiatric:  Negative.   PAST MEDICAL HISTORY:  Significant for hypertension, heart disease,  constipation, anemia, leg pain, reflux disease, diabetes.   PAST SURGICAL HISTORY:  The patient has a midline scar and says her  gallbladder was removed.   SOCIAL HISTORY:  History of smoking one pack per day; quit in May.  No  alcohol.   FAMILY HISTORY:  Unobtainable.   MEDICATIONS:  Multivitamin one per day, Benazepril 20 mg once daily,  Lopressor 100 mg two times daily, Travatan 0.004% once daily for  glaucoma, Spiriva inhaler 1 puff once daily, Nitroglycerine 0.4 mg  sublingual as needed, Tramadol 50 mg every 6 hours as needed, MiraLax  two times daily, Neurontin 600 mg three times daily, iron 325 once  daily, Buspirone 30 mg one pill twice daily, Omeprazole 20 mg two times  daily,  diltiazem 180 mg once daily, metformin 500 mg 1 once daily, Lasix  20 mg once daily, Mirtazapine 30 mg at bedtime, potassium once daily.   ALLERGIES:  ASPIRIN and PENICILLIN.   PHYSICAL EXAMINATION:  Blood pressure is 153/83, heart rate is 100.  General:  Well-appearing, no acute distress.  HEENT:  Normocephalic,  atraumatic.  Cardiovascular:  Regular rate and rhythm.  Respirations non-  labored.  Abdomen:  Soft and nontender.  No palpable masses.  A midline  scar is present.  Extremities are warm and well perfused.  No palpable  pedal or popliteal pulses.  She has strongly palpable bilateral femoral  pulses.  There are no wounds on the lower extremities.  Neurological  exam is non-focal.   DIAGNOSTIC STUDIES:  The patient comes in with a Duplex evaluation  today.  This reveals segmental pressures on the right reveal index of  0.67 in the low thigh, 0.55 in the calf, 0.63 in the DP on the right and  0.55 in the PT, and in the digits it is 0.29 on the left, index on the  low thighs 1.1, on the calf is 0.75 and the ankle is 0.61, and the DP at  0.73  and the PT digit is 0.49.   ASSESSMENT/PLAN:  Bilateral arterial insufficiency.   I had a lengthy discussion with the patient regarding options at this  time should she want an intervention.  It seems that she has significant  rest pain; however, the history is somewhat difficult to obtain.  There  does appear to be an arterial component to her pain.  This is confirmed  by her duplex evaluation.  We have decided to go forward with an  arteriogram with the possibility of an intervention.  I will access her  left femoral artery with a diagnostic study and the possibility of an  intervention on the right side.  I discussed extensively with the  patient the risks of performing this procedure should an intervention be  performed.  These include bleeding from the access site, distal  embolization, which could require emergency bypass, and  possibly  amputation.  She understands all of these risks and says that if it can  help her pain she would be willing to give it a try.  She has been  scheduled for arteriogram on Thursday 04/30/2007.   Jorge Ny, MD  Electronically Signed   VWB/MEDQ  D:  04/20/2007  T:  04/21/2007  Job:  [id number]

## 2010-11-20 NOTE — Assessment & Plan Note (Signed)
[name], [name]                 CHART#:  [id number]   DATE:  02/20/2007                       DOB:  07-14-1934   REFERRING PHYSICIAN:  Erle Crocker.   PROBLEM LIST:  1. GI bleed secondary to ulcer and large hiatal hernia which was      repaired at [facility], May 2008.  She also underwent      splenectomy, excision of gastric ulcer with gastrorrhaphy,      pyloroplasty, and an upper endoscopy.  2. History of small bowel AVMs, as well as gastric AVMs.  3. Coronary artery disease.  4. COPD.  5. Cataracts.  6. Glaucoma.  7. Hypertension.  8. Demyelinating disease of the brain.  9. Cholecystectomy.  10.Hemorrhoidectomy.  11.Depression.   SUBJECTIVE:  The patient is a 76 year old female who presents as a  return patient visit.  She has no particular questions, concerns or  complaints.  Her bowels are moving good with Colace.  She denies any  heartburn, indigestion, rectal bleeding, or black, tarry stool.   ALLERGIES:  ASPIRIN and PENICILLIN.   MEDICATIONS:  1. Nicotine patch.  2. Spiriva.  3. Potassium 20 mEq b.i.d.  4. Metoprolol 100 mg b.i.d.  5. Lasix 40 mg b.i.d.  6. Colace 100 mg b.i.d.  7. Tramadol as needed.  8. Remeron 30 mg nightly.  9. Poly-Iron 150 mg b.i.d.  10.Neurontin 600 mg t.i.d.  11.BuSpar.  12.Omeprazole 20 mg daily.  13.Metformin 500 mg b.i.d.  14.Benazepril.  15.Diltiazem.  16.Alphagan drops.  17.Travatan.   OBJECTIVE:  Weight 181 pounds, height 5 feet 4 inches, temperature 98.1,  blood pressure 160/82, pulse 92.  GENERAL:  She is no apparent distress, alert and oriented x4. Her HEENT  exam is atraumatic, normocephalic.  Pupils are equal and reactive to  light.  Mouth, no oral lesions.  Posterior pharynx without erythema or  exudate.  NECK:  Full range of motion, no lymphadenopathy. LUNGS:  Clear to  auscultation bilaterally.CARDIOVASCULAR EXAM:  Regular rhythm, normal S1  and S2.Her abdomen and bowel sounds are present, soft,  nontender,  nondistended, no rebound or guarding.NEURO:  She has no new focal  neurological deficits.   ASSESSMENT:  The patient is a 76 year old female presented with iron  deficiency anemia felt secondary to arteriovenous malformation.  In May  of 2008 she presented with a GI bleed and was found to have a large  ulcer in her hiatal hernia.  She is status post resection.  She has done  fairly well postoperatively.  Thank you for allowing me to see the  patient in consultation.  My recommendations follow.   RECOMMENDATIONS:  1. Will check CBC and a ferritin on today.  Based on those results, I      will decide if she needs to continue on iron indefinitely.  2. She is to return patient visit to see me in 1 year.  I will call      her at [phone] to give her instructions in regards to her iron.       Kassie Mends, M.D.  Electronically Signed     SM/MEDQ  D:  02/20/2007  T:  02/21/2007  Job:  [id number]   cc:   Erle Crocker, M.D.

## 2010-11-20 NOTE — Assessment & Plan Note (Signed)
[facility]                          [facility]ICE NOTE   NAME:[name], [name]                       MRN:          [id number]  DATE:03/27/2007                            DOB:          [birth year]    CARDIOLOGIST:  Dr. Lewayne Bunting.   PRIMARY CARE PHYSICIAN:  Dr. Erle Crocker.   REASON FOR APPOINTMENT:  Three month followup.   HISTORY OF PRESENT ILLNESS:  [name] is a 76 year old female patient  who returns to the office today for a followup.  She was seen at  [facility] in May by Dr. Andee Lineman with acute hypercarbic  respiratory failure in the setting of acute diastolic congestive heart  failure.  She ruled in for acute coronary syndrome and it was  recommended that she transfer to [facility] for diagnostic  heart catheterization.  The patient declined on this and was eventually  discharged to home.  She saw Dr. Myrtis Ser in followup December 18, 2006.  Again,  she declined proceeding with cardiac catheterization.  She did have an  echocardiogram when she was hospitalized that revealed normal LV  function with an EF of 65% and no segmental wall motion abnormalities.  Of note, her history does include cerebrovascular disease with  documented carotid stenoses in 2001.  She had recently seen her  podiatrist and was set up for ABIs.  This was done September 11 and was  markedly abnormal with an ABI of 0.63 on the right and 0.73 on the left.  She has consultation pending with Dr. Myra Gianotti in [place].   In the office today she notes that she is doing well.  Denies any chest  pain, palpitations, syncope, near syncope, orthopnea, PND or pedal  edema.  She does have chronic dyspnea without much change.  She is on O2  at home secondary to significant COPD.   CURRENT MEDICATIONS:  1. Diltiazem 180 mg daily.  2. Gabapentin 600 mg three times a day.  3. Buspirone 30 mg b.i.d.  4. Multivitamin daily.  5. Travatan eye drops.  6. Benazepril  20 mg daily.  7. Alphagan eye drops.  8. Furosemide 20 mg daily.  9. Alprazolam 0.5 mg nightly p.r.n.  10.Omeprazole 20 mg b.i.d.  11.Potassium 20 mEq daily.  12.Oxygen.  13.Ferrous sulfate 325 mg a day.  14.Mirtazapine 30 mg nightly.  15.Metformin 500 mg daily.   ALLERGIES:  PENICILLIN AND ASPIRIN.   PHYSICAL EXAMINATION:  She is a well-nourished, well-developed female in  no acute distress.  Blood pressure 121/71, pulse 76, weight 181.8 pounds  - previous weight was 170 according to the notes December 18, 2006.  HEENT:  Normal.  NECK:  Without JVD.  CARDIAC:  Normal S1-S2, regular rate and rhythm.  LUNGS:  With decreased breath sounds bilaterally but without wheezing,  rhonchi or rales.  ABDOMEN:  Soft, nontender.  EXTREMITIES:  Without edema.  NEUROLOGIC:  She is alert and oriented x3, cranial nerves II-XII grossly  intact.   IMPRESSION:  1. Chronic diastolic congestive heart failure.  2. Good left ventricular function with an ejection fraction of 65%.  3. Presumed underlying coronary artery disease.      a.     Status post recent non ST elevation myocardial infarction.      b.     Patient declines proceeding with cardiac catheterization.  4. Peripheral arterial disease as evidenced by recent ankle-brachial      indices.      a.     Vascular surgery consult pending in [place].  5. History of upper gastrointestinal bleed status post surgery at [facility] in [place], [place] earlier this      year.  6. Multifocal atrial tachycardia.  7. Diabetes mellitus.  8. Chronic obstructive pulmonary disease.  9. Hypertension.  10.Gastroesophageal reflux disease.   PLAN:  Patient presents to the office today for a 3 month followup.  From a symptomatic standpoint she is doing well.  She still declines  proceeding with cardiac catheterization.  She most likely has underlying  coronary disease.  I have gone over with her again the risks and  benefits  of proceeding with cardiac catheterization.  She still  declines.  At this point in time we plan to:  1. Reinitiate Plavix 75 mg a day for antiplatelet protection in the      setting of underlying coronary artery disease as well as peripheral      arterial disease.  This was to be started when she was hospitalized      at [facility] but for some reason she is not currently on it.      Unfortunately she is ALLERGIC TO ASPIRIN.  2. Continue medications as listed above.  3. Check a BMET and a BNP.  I am a little concerned that her weight      has gone up about 10 pounds since we last saw here.  However, on      exam she seems to be optivolemic.  We will adjust her Lasix if      needed.  4. Arrange fasting lipids and LFTs and going forward we will need to      treat her medically for coronary disease and I do not see that she      is on a statin at this point in time.  We will make      a decision once we know the results of her lipid panel.  5. Follow-up with Dr. Andee Lineman in the next 3 months.      Tereso Newcomer, PA-C  Electronically Signed      Learta Codding, MD,FACC  Electronically Signed   SW/MedQ  DD: 03/27/2007  DT: 03/27/2007  Job #: [id number]   cc:   Erle Crocker, M.D.

## 2010-11-23 NOTE — Discharge Summary (Signed)
[name], CAMPAS                ACCOUNT NO.:  [id number]   MEDICAL RECORD NO.:  [id number]          PATIENT TYPE:  INP   LOCATION:  A203                          FACILITY:  [facility]   PHYSICIAN:  Kassie Mends, M.D.      DATE OF BIRTH:  09/08/1934   DATE OF ADMISSION:  10/15/2006  DATE OF DISCHARGE:  04/19/2008LH                               DISCHARGE SUMMARY   DISCHARGE DIAGNOSES:  1. Gastrointestinal bleed secondary to gastric ulcer located within      the hiatal hernia pouch.  2. Large hiatal hernia.  3. History of occult gastrointestinal bleed secondary to gastric      arteriovenous malformations and small bowel arteriovenous      malformations, maintained on chronic oral iron therapy with stable      hemoglobin from October to April 2008.  4. Coronary artery disease, status post myocardial infarction 20 years      ago, requiring stent placement.  5. Chronic obstructive pulmonary disease.  6. Cataracts.  7. Glaucoma.  8. Hypertension.  9. Demyelinating disease of the brain, followed by Dr. Gerilyn Pilgrim in      [place], [place].  10.Depression.  11.Cholecystectomy.  12.Hemorrhoidectomy.   HISTORY OF PRESENT ILLNESS:  [name] is a 76 year old female who  presented to [facility] in September 2007 with flank pain and  was found to have a hemoglobin of 7.3 with a ferritin of 9.  She had  been maintained on Plavix for an unspecific reason for approximately 20  years because she had a stent placed.  She underwent an  esophagogastroduodenoscopy on September 28 by Dr. Lionel December.  Her  esophagus was normal.  She had a moderate-sized sliding hiatal hernia,  erosive antral esophagitis and normal first, second and third portion of  the duodenum.  Colonoscopy on October 1 by Dr. Kassie Mends revealed  small internal hemorrhoids, otherwise a normal colon without evidence of  polyps, masses, inflammatory changes or vascular ectasias.  She had no  diverticula seen.  She was  placed on Nu-Iron twice a day.  She was  scheduled for capsule endoscopy.  Capsule endoscopy was performed on  October 15.  Two small gastric AVMs were seen.  No bleeding was seen in  the stomach.  No AVMs were actually seen in the small intestines.  The  study was significantly limited by iron-staining throughout the small  intestines.  No ulcers or masses were seen.  The plan was to continue  her on iron indefinitely.  It was presumed that she has small bowel AVMs  and she was to have serial CBCs once a month and the plan was to restart  her Plavix if her hemoglobin remained stable.  She was seen in followup  and her hemoglobin remained stable.  She was seen by her cardiologist  and it was felt that the Plavix was not necessary because she had not  had any cardiac events.  On April 9, she presented to the emergency  department with 1 week of vomiting, shortness of breath, and cough.  She  also had  coffee-grounds contents in her emesis.  She was noted to have a  hemoglobin of 7.1.  The emergency department reports that she had sinus  tachycardia and 2 rounds of nonsustained ventricular tachycardia.  She  was admitted to the hospital for GI bleed and further evaluation.   HOSPITAL COURSE:  She had an esophagogastroduodenoscopy with cold-  forceps biopsies on April 10.  Pictures are attached to her radiographic  evaluation for your review.  The EGD revealed a large hiatal hernia with  a 2 x 3-cm gastric ulcer contained within the sliding hiatal pouch.  The  ulcer was biopsied.  Six biopsies were obtained in the periphery and 2  within the center of the ulcer.  No visible vessel was seen prior to  biopsy.  No fresh blood in the stomach and the etiology of the ulcer was  believed to be ischemic.  She had a normal esophagus without evidence of  Barrett's and a normal duodenal bulb and second portion of the duodenum.  She was also given 2 units of packed red blood cells with an appropriate   transfusion response.  Her hemoglobin went from 7.7 to 11.0.  Following  her endoscopy, her hemoglobin trended down from 11 to 8.8.  She was  again transfused.  Again, her hemoglobin went from 8.8 to 9.7.  Over a 2-  day period, her hemoglobin again trended back down to 8.8.  She received  1 unit of packed red blood cells and her hemoglobin increased to 9.3.  Twenty-four hours later on April 14, her hemoglobin dropped again to  8.6.  An EGD was performed by Dr. Karilyn Cota and again noted was the 2 x 3-  cm ulcer located in the hiatal hernia pouch.  A Gold Probe was used to  cauterize areas which were actively oozing.  Following the EGd she was  coughing, and possibly aspirated and was started on nebulizers and  Levaquin. He described there was some oozing right from the center.  The  ulcer was washed after cauterization and no bleeding was noted.  She  again received 1 unit of packed red blood cells.  Her hemoglobin  increased from 8.6 to 9.3.  Twenty-four hours later, her hemoglobin was  8.7; she again received 1 unit of packed red blood cells and her  hemoglobin was 9.5, on April 17.  On April 18, her hemoglobin was 8.5 to  8.8.  On the day of discharge, her hemoglobin is 8.2.  She is  discharged, afebrile, and hemodynamically stable.   DIAGNOSTIC STUDIES:  1. CT angiogram performed in the emergency department because of      complaint of shortness of breath and spitting up blood:  Impression      -- no PE, hiatal hernia, small fatty lesion in the right      subscapularis muscle was present.  2. Biopsy from gastric ulcers on April 10:  Benign gastric mucosa with      inflammation and ulceration associated with fibropurulent exudate,      no malignancy identified.  3. Chest x-ray, PA and lateral:  April 14 -- large hiatal hernia, mild      interstitial prominence without focal airspace disease, scarring in      the lateral left lung, stable.  DISCHARGE LABORATORY DATA:  CBC from October 25, 2006:  WBC 8.6,  hemoglobin 8.2, platelets 716,000.  BNP, April 15:  Sodium 139,  potassium 3.6, chloride 106, CO2 29, glucose 128, BUN 18, total  bilirubin 0.4, alkaline phosphatase 69, AST 14, ALT 12, total protein  5.3, albumin 2.6, calcium 8.2.  Hemoglobin A1c 5.2.   DISCHARGE MEDICATIONS:  1. Benazepril 20 mg daily.  2. Alphagan eye drops, one drop OS b.i.d.  3. BuSpar 30 mg p.o. b.i.d.  4. Cymbalta 60 mg p.o. b.i.d.  5. Neurontin 600 mg p.o. t.i.d.  6. Nu-Iron 150 mg p.o. b.i.d.  7. Xopenex 0.63 mg inhaled every 8 hours.  8. Levaquin 500 mg IV q.24 h.  9. Reglan 10 mg IV q.a.c. and h.s.  10.Lopressor 100 mg p.o. b.i.d.  11.Remeron 30 mg p.o. nightly.  12.Nicoderm patch 21 mg topically daily.  13.Protonix 40 mg IV q.12h.  14.MiraLax 17 g p.o. daily.  15.Normal saline 50 mL every hour.  16.Carafate 1 g p.o. q.a.c. and h.s.  17.Spiriva 18 mcg inhaled daily.  18.Travatan one drop to the left eye at bedtime nightly.  19.Xanax 0.25 mg p.o. t.i.d.  20.Morphine 2 mg IV every 4 hours as needed for pain.  21.Zofran 4 mg IV every 6 hours as needed for vomiting.  22.Ultram p.o. every 6 hours as needed for pain.  23.Phenergan 12.5 mg IV every 4 hours as needed for nausea and      vomiting.   DIET:  Soft diet.   ALLERGIES:  ASPIRIN and PENICILLIN.      Kassie Mends, M.D.  Electronically Signed     SM/MEDQ  D:  10/25/2006  T:  10/25/2006  Job:  [id number]   cc:   Erle Crocker, M.D.

## 2010-11-23 NOTE — Op Note (Signed)
   NAME:  [name], [name]                          ACCOUNT NO.:  [id number]   MEDICAL RECORD NO.:  [id number]                   PATIENT TYPE:  INP   LOCATION:  A310                                 FACILITY:  [facility]   PHYSICIAN:  Jerolyn Shin C. Katrinka Blazing, M.D.                DATE OF BIRTH:  Dec 23, 1934   DATE OF PROCEDURE:  DATE OF DISCHARGE:                                 OPERATIVE REPORT   PREOPERATIVE DIAGNOSIS:  Internal and external hemorrhoids.   POSTOPERATIVE DIAGNOSIS:  Internal and external hemorrhoids.   PROCEDURE:  Hemorrhoidectomy, internal and external complexes.   SURGEON:  Dirk Dress. Katrinka Blazing, M.D.   DESCRIPTION OF PROCEDURE:  Under spinal anesthesia the patient was prepped  and draped in a sterile field.  Examination of the anal area revealed 3  large complexes of internal and external hemorrhoids with one small complex.  It was elected to do the 3 major complexes so as not to leave the patient  with significant stenosis.   Operating anoscope was placed.  A sponge was placed to prevent fecal  contamination.  The left posterior group was isolated first.  It was ligated  at the dentate line.  This was done with 2 ligatures of 2-0 chromic.  Incision was made encompassing the internal an external complexes.  Using  electrocautery the internal and external complexes were excised without  difficulty.  Hemostasis was achieved.  The sphincter muscle was inspected  and was intact.  The mucosal defect was closed with running locking 2-0  chromic.  The anterior complex and the right lateral complex were excised in  a similar manner and closed with a similar technique.  The patient had a  somewhat patulous anus, but it was felt that there was good excision of the  major hemorrhoids.   There was a small hemorrhoidal complex in the left anterior group which was  left intact.  This may have to be excised at another time.  The patient  tolerated the procedure well.  The perianal area was  infiltrated with 30 cc  of 0.5% Marcaine with epinephrine.  The patient tolerated the procedure  well.  The sponge was removed without difficulty.  She was transferred to a  bed and taken to the postanesthetic care unit for further monitoring.                                               Dirk Dress. Katrinka Blazing, M.D.    LCS/MEDQ  D:  07/16/2002  T:  07/17/2002  Job:  [id number]

## 2010-11-23 NOTE — Op Note (Signed)
NAME:  [name], [name]                          ACCOUNT NO.:  [id number]   MEDICAL RECORD NO.:  [id number]                   PATIENT TYPE:  INP   LOCATION:  2315                                 FACILITY:  [facility]   PHYSICIAN:  Anselmo Rod, M.D.               DATE OF BIRTH:  1934/09/18   DATE OF PROCEDURE:  03/10/2004  DATE OF DISCHARGE:                                 OPERATIVE REPORT   PROCEDURE PERFORMED:  Esophagogastroduodenoscopy with antral biopsies.   ENDOSCOPIST:  Charna Elizabeth, M.D.   INSTRUMENT USED:  Olympus video panendoscope.   INDICATIONS FOR PROCEDURE:  The patient is a 76 year old African-American  female with a history of upper gastrointestinal bleeding (hematemesis).  Rule out peptic ulcer disease, esophagitis, gastritis, etc.   PREPROCEDURE PREPARATION:  Informed consent was procured from the patient.  The patient was fasted for eight hours prior to the procedure.   PREPROCEDURE PHYSICAL:  The patient had stable vital signs.  Neck supple,  chest clear to auscultation.  S1, S2 regular.  Abdomen soft with normal  bowel sounds.   DESCRIPTION OF PROCEDURE:  The patient was placed in the left lateral  decubitus position and sedated with 80 mg of Demerol and 5 mg of Versed in  slow incremental doses.  Once the patient was adequately sedated and  maintained on low-flow oxygen and continuous cardiac monitoring, the Olympus  video panendoscope was advanced through the mouth piece over the tongue into  the esophagus under direct vision.  The entire esophagus appeared normal  with no evidence of ring, stricture, masses, esophagitis or Barrett's  mucosa.  The scope was then advanced to the stomach.  A large ulcer was seen  in the proximal stomach.  A large hiatal hernia was seen on retroflexion as  well.  Antral biopsies were done to rule out presence of Helicobacter pylori  by CLO test.  There was a clean ulcer base noted but no evidence of fresh  bleeding.  The proximal  small bowel appeared normal. There was no outlet  obstruction.   IMPRESSION:  1.  Large ulcer in the proximal stomach with clean base.  No evidence of      fresh ongoing bleeding.  2.  Large hiatal hernia.  3.  Antral biopsies done for Helicobacter pylori by CLO test.  4.  Normal-appearing esophagus and proximal small bowel.   RECOMMENDATIONS:  1.  Repeat esophagogastroduodenoscopy in eight to 12 weeks to document      healing.  2.  Continue proton pump inhibitor.  3.  Start the patient on a soft diet and advance as tolerated.  4.  Continue Protonix for now.   Further recommendations will be made in follow-up.  Anselmo Rod, M.D.    JNM/MEDQ  D:  03/10/2004  T:  03/12/2004  Job:  [id number]   cc:   Melissa L. Ladona Ridgel, MD  Fax: [phone]

## 2010-11-23 NOTE — Discharge Summary (Signed)
[name], [name]                ACCOUNT NO.:  [id number]   MEDICAL RECORD NO.:  [id number]          PATIENT TYPE:  INP   LOCATION:  A225                          FACILITY:  [facility]   PHYSICIAN:  Vania Rea, M.D. DATE OF BIRTH:  Nov 02, 1934   DATE OF ADMISSION:  05/30/2005  DATE OF DISCHARGE:  11/25/2006LH                                 DISCHARGE SUMMARY   PRIMARY CARE PHYSICIAN:  Dr. Elpidio Anis.   DICTATING PHYSICIAN:  Dr. Vania Rea.   DISCHARGE DIAGNOSES:  1.  Upper gastrointestinal bleeding secondary to extensive hemorrhagic      gastritis.  2.  Extensive hemorrhagic gastritis.  3.  Uncontrolled hypertension, now controlled.  4.  Severe hypokalemia, resolved.  5.  Other medical problems as noted in the patient's record.   DISPOSITION:  Discharged to home.   DISCHARGE CONDITION:  Stable.   DISCHARGE MEDICATIONS:  1.  Lopressor increased by 50 mg twice daily.  2.  Iron sulfate 325 mg twice daily.  3.  Ambien 10 mg at bedtime.  4.  Avapro 300 mg daily.  5.  Remeron 30 mg at bedtime.  6.  Lorcet 7.5/500 three times daily.  7.  Diltiazem 90 mg four times daily.  8.  Xanax 0.5 mg three times daily.  9.  Reglan 10 mg at bedtime.   HOSPITAL COURSE:  Please refer to admission History and Physical.  This is a  76 year old African-American lady with a history of demyelinating disorder  and peptic ulcer disease, osteoarthritis, anxiety and depression, who  presented with a history of nausea and vomiting coffee-ground material, as  well as passage of blood and melena stool.  The patient did not have any  hypotensive episodes or syncope, but she was noted to be markedly  hypertensive on admission possibly because of failure to digest her  antihypertensive medications.  The patient was admitted, she had blood typed  and screened, but did not require transfusion this admission.  She had upper  endoscopy by Dr. Katrinka Blazing and the results above were noted.  Post procedure  the  patient was able to be started on a full liquid diet and today has tolerated  a soft diet without difficulty.   She is now in a stable condition and anxious to be discharged home and the  patient is being discharged to be followed up by her primary care physician  Dr. Katrinka Blazing.   This morning her physical exam revealed a temperature of 98.5, pulse 98,  respirations 18, blood pressure 132/80, her weight is recorded as 162.7.  Her mucous membranes are pink.  Her chest is clear to auscultation.  Her  abdomen is mildly distended, but is soft and nontender.  She has no lower  extremity edema.   Her lab work revealed a white count of 7.3, hemoglobin 10, hematocrit 29.6,  platelets 512.  Her MCV is 88.6.  Her sodium is 136, potassium 3.9, it was  2.6 on admission, chloride 107, CO2 24, glucose 108, BUN 4, creatinine 0.7.  Her liver function tests were unremarkable except admission her calcium was  8.6,  her lipase was 36, her urinalysis was not suggestive of infection.   FOLLOWUP:  The patient will follow-up with her primary care physician Dr.  Elpidio Anis.      Vania Rea, M.D.  Electronically Signed     LC/MEDQ  D:  06/01/2005  T:  06/01/2005  Job:  [id number]   cc:   Dirk Dress. Katrinka Blazing, M.D.  Fax: [phone]

## 2010-11-23 NOTE — H&P (Signed)
[facility]  Patient:    [name], [name] Visit Number: [id number] MRN: [id number]          Service Type: REC Location: RAD Attending Physician:  Dessa Phi Dictated by:   Elpidio Anis, M.D. Admit Date:  09/17/2001                           History and Physical  CHIEF COMPLAINT:  This is a 76 year old female, with a history of recurrent nausea, epigastric pain, postprandial bloating.  HISTORY OF PRESENT ILLNESS:  The patient has a history of peptic ulcer disease.  She had endoscopy done on September 16, 2001.  Endoscopy showed acute gastritis with two irregular ulcers at the cardia of the stomach.  Biopsies were compatible with acute inflammation.  She was treated with Prevacid and Carafate and had continued symptoms.  She had a HIDA scan done which showed no emptying of the gallbladder, compatible with severe chronic cholecystitis. The patient is scheduled to have laparoscopic cholecystectomy.  PAST MEDICAL HISTORY:  1. She has combined anemia.  She has had this since 1989.  She has had     hemoglobins as low as 5.  She has not had overt bleeding noted, and she     has had only one guaiac positive stool, and this was during a time when     she had an active gastric ulcer.  She has been evaluated by hematology and     she has been treated with iron.  Her hemoglobin has been as high as 10.4,     but she basically runs between 8 and 9.  Present hemoglobin is 8.9.  2. Hypertension.  3. Mild peripheral vascular disease.  4. Atherosclerotic heart disease, status post myocardial infarction in 1990;     presently asymptomatic.  5. Degenerative joint disease.  6. Chronic obstructive lung disease.  CURRENT MEDICATIONS:  1. Prevacid 30 mg b.i.d.  2. Carafate 1 g q.i.d.  3. Micardis HCT 80/12.5 mg q.d.  4. Restoril 30 mg q.h.s.  5. Ferrous sulfate 325 mg q.d.  6. Ultracet one q.i.d.  7. Atenolol 50 mg q.d.  8. Xanax 0.5 mg t.i.d. p.r.n.  SOCIAL  HISTORY:  The patient smokes one to two packs of cigarettes a day, since age 52.  She does not drink.  FAMILY HISTORY:  Positive for arthritis, diabetes mellitus, hypertension.  PHYSICAL EXAMINATION:  VITAL SIGNS:  Blood pressure 160/100, pulse 76, respirations 18.  Weight 154 pounds.  HEENT:  Unremarkable.  NECK:  Supple.  No JVD or bruits.  CHEST:  Clear to auscultation.  HEART:  Regular rate and rhythm without murmur, gallop, or rub.  ABDOMEN:  Moderate epigastric tenderness.  Normal bowel sounds.  No lower quadrant tenderness.  EXTREMITIES:  No clubbing, cyanosis, or edema.  NEUROLOGIC:  Nonfocal.  IMPRESSION:  1. Chronic acalculus cholecystitis, symptomatic.  2. Peptic ulcer disease.  3. Hypertension.  4. Depression.  5. Atherosclerotic heart disease, stable.  PLAN:  The patient is scheduled for laparoscopic cholecystectomy Dictated by:   Elpidio Anis, M.D. Attending Physician:  Dessa Phi DD:  10/13/01 TD:  10/13/01 Job: [id number]

## 2010-11-23 NOTE — Discharge Summary (Signed)
NAME:  [name], [name]                          ACCOUNT NO.:  [id number]   MEDICAL RECORD NO.:  [id number]                   PATIENT TYPE:  INP   LOCATION:  A310                                 FACILITY:  [facility]   PHYSICIAN:  Jerolyn Shin C. Katrinka Blazing, M.D.                DATE OF BIRTH:  07-08-1935   DATE OF ADMISSION:  07/16/2002  DATE OF DISCHARGE:  07/19/2002                                 DISCHARGE SUMMARY   DIAGNOSES:  1. Upper gastrointestinal bleeding with anemia.  2. Severe pain and esophageal candidiasis.  3. Acute gastric ulcer.  4. Ulcerations of the ileocecal valve.  5. __________ .  6. Peripheral vascular disease.  7. Osteoarthritis.   SPECIAL PROCEDURES:  1. EGD with biopsy.  2. Total colonoscopy with biopsy.   DISPOSITION:  The patient was discharged home in improved condition.   DISCHARGE MEDICATIONS:  1. Demerol 50 mg q.i.d.  2. Anusol-HC suppositories t.i.d.  3. Mycelex Troches 5 times daily  4. Prevacid 30 mg twice a day.  5. Atenolol 50 mg daily.  6. Xanax 0.5 mg 3 times a daily.  7. Hemacyte Plus two daily.  8. Dyazide 1 daily.  9. Megace 2 tablespoons once daily.   The patient is advised to be seen in the office in two weeks.  She was  advised not to take __________.   SUMMARY:  This 76 year old female admitted through the emergency room for GI  bleeding.  She gives a history bright red rectal bleeding in bowel movements  the evening prior to admission.  She had three bowel movements on the  morning of admission which were bloody.  A nasogastric tube was placed.  She  had a large amount of coffee ground material in the stomach. It was felt  that she had an apparent GI bleed.  She has a history of peptic ulcer  bleeding dating back to 1989.  She had peptic ulcers in 2001 which were  treated with combination therapy for positive H.pylori.  She had  colonoscopies in 1997, 2001, and 2002.  She had a history of chronic iron  deficiency with decreased iron stores  and iron saturation.  Bone marrow  aspirate in July of 2003 was significant only in that she had absent Iron  stores,  it was felt that she probably had recurrent ulcers related to  nonsteroidal therapy.  Other specifics __________ .   The patient was admitted for a GI series.  She underwent bowel prep.  Her  initial hemoglobin was 9 and after transfusion, she underwent EGD and  colonoscopy.  EGD showed severe pain, esophageal candidiasis, and acute  gastritis beginning and acute gastric ulcer located at the body along the  lesser curvature without evidence of  bleeding.  Colonoscopy showed  irregular ulceration in the ileocecal valve.  She was started on Prevacid  and Carafate. The patient did fairly well, and a  CT of the abdomen was  unremarkable.  Posttransfusion hemoglobin was 11.2.  She remained stable,  she did not have any more abdominal pain, but she did have some perirectal  pain.  It was felt that the perirectal pain was due to her pathology.  She  was stable and able to discharge her home and schedule a hemorrhoidectomy in  about two weeks after we have had adequate time to follow up her cultures  and to treat her acute peptic ulcer disease.                                               Dirk Dress. Katrinka Blazing, M.D.    LCS/MEDQ  D:  09/17/2002  T:  09/18/2002  Job:  [id number]

## 2010-11-23 NOTE — Op Note (Signed)
[facility]  Patient:    CELES, DEDIC Visit Number: [id number] MRN: [id number]          Service Type: REC Location: RAD Attending Physician:  Dessa Phi Dictated by:   Elpidio Anis, M.D. Proc. Date: 10/13/01 Admit Date:  09/17/2001                             Operative Report  PREOPERATIVE DIAGNOSIS:  Chronic cholecystitis.  POSTOPERATIVE DIAGNOSIS:  Chronic cholecystitis.  PROCEDURE:  Laparoscopic cholecystectomy.  SURGEON:  Elpidio Anis, M.D.  DESCRIPTION OF PROCEDURE:  Under general endotracheal anesthesia, the patients abdomen was prepped and draped in a sterile field. A supraumbilical incision was made. The Veress needle was inserted uneventfully and confirmed by the saline drop test. The abdomen was insufflated with 3 liters of CO2. Using a ______ guide, a 10 mm port was placed uneventfully. The laparoscope was placed. Right upper quadrant incisions were made and a 10 mm port and two 5 mm ports were placed under videoscopic guidance. The gallbladder was positioned. The cystic duct was dissected, clipped with 4-0 clips and divided. The cystic artery was dissected, clipped with three clips and divided. The gallbladder was separated from the infrahepatic space without difficulty. There was no bleeding from the gallbladder bed. There was no evidence of bile leak. Copious irrigation was used until the fluid returned clear. The gallbladder was retrieved intact. Further irrigation was carried out. The patient tolerated the procedure well. CO2 was allowed to escape from the abdomen and the ports were removed. The incisions were closed using #0 Dexon on the fascia of the larger incision and staples on the skin. Dressings were placed. She was awakened from anesthesia uneventfully, transferred to a bed and taken to the post anesthesia care unit. Dictated by:   Elpidio Anis, M.D. Attending Physician:  Dessa Phi DD:  10/13/01 TD:   10/13/01 Job: [id number]

## 2010-11-23 NOTE — Discharge Summary (Signed)
NAME:  [name], [name]                          ACCOUNT NO.:  [id number]   MEDICAL RECORD NO.:  [id number]                   PATIENT TYPE:  INP   LOCATION:  A222                                 FACILITY:  [facility]   PHYSICIAN:  Jerolyn Shin C. Katrinka Blazing, M.D.                DATE OF BIRTH:  1934-09-03   DATE OF ADMISSION:  12/14/2002  DATE OF DISCHARGE:  12/30/2002                                 DISCHARGE SUMMARY   DISCHARGE DIAGNOSES:  1. Acute progressive polyneuropathy and myopathy, exact etiology unknown,     treated empirically.  2. Acute infectious bronchiolitis.  3. Chronic anemia.  4. Dehydration.  5. Gastroesophageal reflux disease.  6. Peptic ulcer disease.  7. Osteoarthritis.  8. Depression with anxiety.  9. Acute gastritis with antral ulcer, healing.   SPECIAL PROCEDURE:  On December 29, 2002 - esophagogastroduodenoscopy and total  colonoscopy.   DISPOSITION:  The patient is discharged home and is much improved.   DISCHARGE MEDICATIONS:  1. Pletal 100 mg b.i.d.  2. Neurontin 300 mg t.i.d.  3. Avinza 30 mg daily.  4. Hydrocodone 7.5 mg b.i.d. p.r.n.  5. Avalide 150/12.5 daily.  6. Remeron 30 mg q.h.s.  7. Prevacid 30 mg b.i.d.  8. Ambien 10 mg q.h.s.  9. Hemocyte Plus one daily.   FOLLOW-UP:  The patient is scheduled to be seen in the office in two weeks  and she will see Dr. Gerilyn Pilgrim in his office in three weeks.   SUMMARY:  A 76 year old female admitted from the emergency room for  evaluation of altered mental status, new onset of left peroneal nerve palsy,  and dehydration.  She had a course of deteriorating status.  She gives a  history of falling at home three days prior to admission.  She does not know  how long she was on the floor.  She had difficulty getting back in bed but  after she got back in bed she only felt sore so she went to sleep.  She had  diffuse pain over the past three days.  She has felt much weaker.  She noted  that she was having increased  difficulty with walking and she started  dragging her left leg more.  At the time of admission to the emergency room  she could not stand or walk.  In the emergency room she had a low-grade  temperature elevation of 100 degrees and apparent left foot drop which were  new findings.  Past history reveals that she has a peripheral neuropathy and  has had increased paresthesias of her feet and hands.  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  Other medical problems include peptic ulcer disease, coronary artery  disease, chronic obstructive lung disease, hypertension, peripheral vascular  disease, chronic depression, and chronic thrombocytosis.  Medications are  given in the admission note.  On evaluation, she was  alert.  Blood pressure  102/60, pulse 70, respirations 20, temperature 100.2.  There was no evidence  of head and neck trauma.  Lungs were clear.  Heart regular.  Abdomen soft,  nontender.  Extremities with mild swelling of the left lower extremity,  especially at the ankle, lower leg, and dorsum of the foot.  There was no  swelling on the right.  Neurologically she had decreased grip strength in  her upper extremities, more prominent on the left compared with the right.  She also had mild left arm drift.  She had obvious foot drop on the left and  she could not dorsiflex her left foot past neutral.  She had decreased  sensation in the feet and legs bilaterally.  She had decreased sensation to  pinprick, light touch, and position sensation.  She could not stand without  assistance.   The patient was admitted and was started on IV therapy for mild dehydration.  She was continued on her baseline medications.  MRI of the brain and upper  spinal cord were ordered.  Neurology consult was obtained and PT was ordered  for evaluation.  Significant abnormal lab values were blood gases with pH  7.43, PCO2 40, PO2 62 on room air.  White count  was 8.4, hemoglobin 9.5,  hematocrit 27.6.  Prior to discharge, white count was 7.7, hemoglobin 9.6,  hematocrit 29.1.  There was no significant change during this  hospitalization.  BUN on admission was 24 with creatinine of 0.9.  Potassium  was 4.1.  Prior to discharge BUN was 12 with creatinine of 0.7.  There were  no elevations in the liver function studies.  Cardiac enzymes were negative  for myocardial injury.  Serum gamma globulin level was 62 which is only  mildly elevated.  B12 and folate levels were high; iron saturation was 6%.  ACTH was normal at 17, FSH at 4.5, LH 61.2, prolactin 5.  Heavy metal screen  was negative.  Blood cultures revealed no growth after five days.  RPR was  nonreactive.  Rheumatoid factor was negative, ANA was negative.   HOSPITAL COURSE:  The patient complained of pain all over.  Over the first  couple of days she appeared to be regressing.  She was seen in consultation  by Dr. Gerilyn Pilgrim on June 9 and he suggested IV immunoglobulin use.  Over the  next few days her pain improved.  She had mild hypokalemia which was treated  with potassium replacement.  MRI of the brain was done and the impression  here was that she had extensive white matter disease felt to be secondary to  chronic small vessel ischemic change or a demyelinating process such as  multiple sclerosis.  MRI of her C-spine revealed multilevel cervical  spondylosis with small disk protrusions at C3-4 and C4-5 with mild central  stenosis at C3-4.  Mammogram was normal.  Chest x-ray showed interstitial  air space opacity in the left lower lobe compatible with pneumonia.  These  also were felt to be compatible with infectious bronchiolitis.  Abdominal  ultrasound was unremarkable.  MRI of the pelvis was normal.  EEG showed a  normal recording.  EKG showed tachycardia.  Echocardiogram showed a normal left ventricle with normal systolic function with ejection fraction of 50-  55% without any wall  abnormalities or significant valvular abnormalities.   Starting about June 14 the patient began to feel better.  It was noted that  her hemoglobin was drifting downward over the  past week.  Her general mental  status and her overall physical status improved.  She received the  immunoglobulins but her improved status started before she received the  immunoglobulins.  The exact etiology is not known.  Upper extremity motor  strength increased to 5/5 and lower extremity motor strength was 4/5 on the  right and 3/5 on the left.  Dorsiflexion in the left foot was improved and  her ambulatory tolerance was improved.  We had stool guaiacs done because of  anemia and these were positive.  It was decided that the patient would need  to have EGD and colonoscopy.  Hemoglobin drifted down to 8.1 and she was  transfused.  She tolerated her transfusion well.  Post transfusion  hemoglobin was 9.6.  On June 23 the EGD showed subacute gastritis with  healing antral ulcer.  Colonoscopy showed no evidence of colon abnormalities  with healed cecal ulcer which she has had in the  past.  Over the next day or so the patient made continued improvement.  She  was fitted with a left AFO in a shoe because of foot drop.  She was felt to  be stable from the therapist's standpoint.  She was medically stable.  She  was therefore discharged home with plans for close follow-up in the office  and close follow-up by Dr. Gerilyn Pilgrim.                                               Dirk Dress. Katrinka Blazing, M.D.    LCS/MEDQ  D:  01/11/2003  T:  01/11/2003  Job:  [id number]

## 2010-11-23 NOTE — Group Therapy Note (Signed)
NAMEVERDIA, BOLT                ACCOUNT NO.:  [id number]   MEDICAL RECORD NO.:  [id number]          PATIENT TYPE:  INP   LOCATION:  A203                          FACILITY:  [facility]   PHYSICIAN:  Margaretmary Dys, M.D.DATE OF BIRTH:  Mar 29, 1935   DATE OF PROCEDURE:  10/19/2006  DATE OF DISCHARGE:                                 PROGRESS NOTE   SUBJECTIVE:  The patient was nauseous this morning and actually vomited  some coffee ground substances.  Discussed her care with Dr. Karilyn Cota,  gastroenterologist, and the plan is to transfuse her with 1 unit and  continue to follow hemoglobin and hematocrit.  She denies any epigastric  pain but still feels weak and lethargic.  She remains on a full liquid  diet for now.  She denies any fevers or chills.  I spent an extended  amount of time talking to her daughters and son, about 8 of them, on her  current clinical status.   OBJECTIVE:  GENERAL:  Conscious, alert, comfortable, not in acute  distress.  VITAL SIGNS:  Blood pressure was stable at 130/84, pulse 103,  respirations 18, temperature 98.5 degrees Fahrenheit with an oxygen  saturation of 94% on room air.  HEENT:  Normocephalic and atraumatic.  Oral mucosa was moist.  No  exudates.  NECK: Supple, no JVD.  LUNGS:  Clear clinically with good air entry bilaterally.  HEART:  S1, S2 regular.  ABDOMEN:  Soft.  EXTREMITIES:  No edema.   LABORATORY DATA:  Hemoglobin is down today at 8.8.  Hematocrit is also  down to 25.8.  Platelet count was 613.  Her basic metabolic profile was  normal.   ASSESSMENT AND PLAN:  1. Anemia with acute blood  loss, likely secondary to gastric ulcer.      The patient appears to still be bleeding as her hemoglobin and      hematocrit are dropping.  We will proceed with transfusion of 1      unit of packed red blood cells today.  Will continue a proton pump      inhibitor b.i.d. and also recommendations by GI.  2. History of asthma.  The patient is stable.   Continue on current      nebulizer and inhaler therapy.  3. Hypokalemia.  Potassium replacement complete.  Potassium level is      now back to normal.  4. Hypertension.  The patient is stable.  Will continue      antihypertensive therapy.   DISPOSITION:  I discussed with the family that we are awaiting biopsy  reports from the ulcer.  H. pylori serology is also pending.  We will  continue on proton pump inhibitor b.i.d.  Will proceed with transfusion  of 1 unit of blood.  The patient continues followup by  gastroenterologist.      Margaretmary Dys, M.D.  Electronically Signed     AM/MEDQ  D:  10/19/2006  T:  10/19/2006  Job:  [id number]

## 2010-11-23 NOTE — Group Therapy Note (Signed)
[name], [name]                ACCOUNT NO.:  [id number]   MEDICAL RECORD NO.:  [id number]          PATIENT TYPE:  INP   LOCATION:  A227                          FACILITY:  [facility]   PHYSICIAN:  Osvaldo Shipper, MD     DATE OF BIRTH:  Jan 28, 1935   DATE OF PROCEDURE:  04/07/2006  DATE OF DISCHARGE:                                   PROGRESS NOTE   SUBJECTIVELY:  The patient is feeling better, no complaints offered today.  She has not had  any blood in her stools, any black colored stools.  She does not have any  nausea, vomiting.  She feels a little bit better in terms of her weakness.  She complained of some abdominal discomfort associated with the prep for the  colonoscopy; otherwise, she said that has also improved.   OBJECTIVELY:  She continues to be afebrile, heart rate about 110, regular, respiratory  rate is about 14-16, blood pressure 122/88, saturation 96% on room air.  LUNGS:  Clear to auscultation bilaterally.  CARDIOVASCULAR:  S1, S2 is normal, regular.  ABDOMEN:  Soft, nontender, nondistended.  Bowel sounds are present.  No mass  or organomegaly appreciated.  EXTREMITIES:  Without edema.  Peripheral pulses are palpable.  Patient is alert, oriented x3.   LAB DATA:  Showed a hemoglobin of 9.8, yesterday it was 10.2, the day before  was 10.3.  White count is 8.9, MCV is 83, platelet count is 863,000.   Sodium is 146, potassium is 3.4, glucose 128, BUN is 5, other parameters are  normal.   ASSESSMENT AND PLAN:  1. Possible gastrointestinal bleed.  Patient underwent an      esophagogastroduodenoscopy o April 04, 2006, after she presented      with severe anemia.  Patient was found to have a moderate sized sliding      hiatal hernia with a scar but no active ulceration.  She also was found      to have erosive antral gastritis.  Really no lesions were identified      which could account for anemia.  Hence, colonoscopy was recommended and      which was done today and  Dr. Cira Servant reported to me over the phone that      no concerning lesions were found on the colonoscopy.  Hence, the      patient will need to have a capsule study which can be done as an      outpatient.  The patient in the meantime will be monitored for one more      day to make sure her hemoglobin does not drop any further.  2. Anemia.  As mentioned above, is likely from chronic blood loss.  She      did not give any history of any acute blood loss recently in the form      of either melena or hematochezia.  She underwent iron profile studies      which suggested iron deficiency anemia.  Hence, the patient will be      started on Nu-Iron.  3. Her other  medical issues remained stable in the form of glaucoma,      depression and anxiety.  She also had chronic obstructive pulmonary      disease which was controlled with inhalers.  4. Her hypokalemia will be corrected today with potassium.  5. She also has a history of coronary artery disease which is also stable      at this time.  6. Hypertension.  The patient's blood pressure medications were held      initially because of her hypotension, but her blood pressures have      stabilized.  She may return to her home antihypertensive regimen.   PLAN:  1. Discharging patient tomorrow if her H&H remains stable.  2. Outpatient capsule study.      Osvaldo Shipper, MD  Electronically Signed     GK/MEDQ  D:  04/07/2006  T:  04/07/2006  Job:  [id number]

## 2010-11-23 NOTE — Procedures (Signed)
[name], [name]                ACCOUNT NO.:  [id number]   MEDICAL RECORD NO.:  [id number]          PATIENT TYPE:  OUT   LOCATION:  RAD                           FACILITY:  [facility]   PHYSICIAN:  Vida Roller, M.D.   DATE OF BIRTH:  1935-05-23   DATE OF PROCEDURE:  04/29/2005  DATE OF DISCHARGE:                                  ECHOCARDIOGRAM   PRIMARY CARE PHYSICIAN:  Dr. Katrinka Blazing.   TAPE NUMBER:  [id number]   TAPE COUNT:  [id number]   CLINICAL INFORMATION:  This is 76 year old woman for LV systolic function.  Previous echocardiogram from June 2004 was essentially normal with just  trace mitral regurgitation.   Today's study is technically difficult due to poor echo windows.   M-MODE TRACINGS:  1.  The aorta is 23 mm.  2.  The left atrium is 38 mm.  3.  The septum is 13 mm.  4.  The posterior wall is 12 mm.  5.  The left ventricular diastolic dimension is 40 mm.  6.  The left ventricular systolic dimension is 31 mm.   2-D AND DOPPLER IMAGING:  1.  The left ventricle is normal size with preserved LV systolic function.      Estimated ejection fraction 65-70%.  There were no obvious wall motion      abnormalities.  There is mild left ventricular hypertrophy.  2.  The right ventricle is normal size with normal systolic function.  3.  There is mild biatrial enlargement.  4.  The aortic valve is not well seen, but does not appear to have any      significant stenosis.  5.  The mitral valve also not well seen has trace regurgitation.  6.  There is no pericardial effusion.   ASSESSMENT:  Essentially no significant change from previous, but a  technically difficult study.     Vida Roller, M.D.  Electronically Signed    JH/MEDQ  D:  04/29/2005  T:  04/29/2005  Job:  [id number]

## 2010-11-23 NOTE — Op Note (Signed)
NAMEMIRAKLE, TOMLIN                ACCOUNT NO.:  [id number]   MEDICAL RECORD NO.:  [id number]          PATIENT TYPE:  AMB   LOCATION:  DAY                           FACILITY:  [facility]   PHYSICIAN:  Kassie Mends, M.D.      DATE OF BIRTH:  1935-03-15   DATE OF PROCEDURE:  04/21/2006  DATE OF DISCHARGE:  04/21/2006                                 OPERATIVE REPORT   PROCEDURE:  Capsule endoscopy.   INDICATION FOR EXAM:  A 76 year old female with heme positive stool/occult  GI bleed and iron-deficiency.  Her hemoglobin in September 2007 was 7.3,  with a ferritin of 7.  The EGD and colonoscopy in 2007 did not reveal any  etiology for her anemia.   PROCEDURE DATA:  Height 60 inches, weight 155 pounds, waist 44 inches.  Build stocky.  Gastric passage time:  18 minutes.  Small bowel passage time:  3 hours 21 minutes.   PROCEDURE INFORMATION AND FINDINGS:  Two small gastric AVMs.  No active  bleeding in stomach.  No AVMs seen in the small intestines.  Study  significantly limited by iron staining throughout the small intestines.  No  ulcers or masses seen.   SUMMARY AND RECOMMENDATIONS:  Continue iron indefinitely. The patient likely  has small bowel and gastric AVMs contributing to her anemia.  Check CBC in  one month.  If hemoglobin not improved, then the patient may have atrophic  gastritis or may need PPI discontinued and may need iron supplementation.  When hemoglobin stable, check CBC every 3 months.  Restart Plavix when  hemoglobin is stable.      Kassie Mends, M.D.  Electronically Signed     SM/MEDQ  D:  05/01/2006  T:  05/02/2006  Job:  [id number]   cc:   Erle Crocker, M.D.

## 2010-11-23 NOTE — Consult Note (Signed)
   NAME:  [name], [name]                          ACCOUNT NO.:  [id number]   MEDICAL RECORD NO.:  [id number]                   PATIENT TYPE:  INP   LOCATION:  A222                                 FACILITY:  [facility]   PHYSICIAN:  Kofi A. Gerilyn Pilgrim, M.D.              DATE OF BIRTH:  1934-08-07   DATE OF CONSULTATION:  12/16/2002  DATE OF DISCHARGE:                                   CONSULTATION   REPORT TITLE:  NEUROLOGY CONSULTATION/PROGRESS NOTE   REFERRING PHYSICIAN:  Jerolyn Shin C. Katrinka Blazing, M.D.   HISTORY:  The patient does not report any significant improvement in pain or  in leg weakness.   PHYSICAL EXAMINATION:  GENERAL:  Shows that she is essentially in  significant pain still.  VITAL SIGNS:  T-max 100.9, blood pressure 99/63, pulse 119, respirations 18.  MUSCULOSKELETAL:  She has significant pain with movement involving the legs.  She has good strength in the upper extremities.  Lower extremities reveal  she has 3/5 strength, 3+ to 4 strength proximally and 0-5 in the left foot  and distal muscles interestingly; however, she has a relatively preserved  plantar flexion, inversion is 0-5 on the left.  She has about 4-5 strength  both proximal and distal on the right in the right leg.  Reflexes are  present and are preserved but are clearly diminished.  Plantar flexion was  downgoing.   LABORATORY DATA:  So far homocystine level is 7.  Vitamin B-12 level is  1,086.  CPK 274.  TSH 0.198.  Heavy metal screen is actually pending.  RPR  nonreactive.  Rheumatoid factor negative.  ANA negative.   ASSESSMENT/PLAN:  Progressive leg weakness, etiology unclear.  Electromyogram shows evidence of marked polyneuropathy.  Electroencephalogram is pending.  We may again try to arrange for inpatient  electromyogram.  We may consider intravenous myoglobulins.                                               Kofi A. Gerilyn Pilgrim, M.D.    KAD/MEDQ  D:  12/17/2002  T:  12/17/2002  Job:  [id number]   cc:   Dirk Dress. Katrinka Blazing, M.D.  [address]  [place]  [place] [address]  Fax: [phone]

## 2010-11-23 NOTE — Consult Note (Signed)
NAME:  [name], [name]                          ACCOUNT NO.:  [id number]   MEDICAL RECORD NO.:  [id number]                   PATIENT TYPE:  INP   LOCATION:  A222                                 FACILITY:  [facility]   PHYSICIAN:  Alan Mulder, M.D.            DATE OF BIRTH:  1934/09/29   DATE OF CONSULTATION:  DATE OF DISCHARGE:                                   CONSULTATION   ENDOCRINOLOGY CONSULTATION:   REASON FOR CONSULTATION:  Abnormal level of TSH.   ASSESSMENT:  1. Mild suppression of pituitary-thyroid probe, most likely central in     origin caused by hypothalamic-pituitary axis abnormalities most likely     related to the patient's neurologic disease, ? seizure, ? polyneuropathy,     or demyelinating disease.  2. Euthyroid patient clinically and biochemically with normal level of     thyroxine (T4) and triiodothyronine (T3).  3. Doubt the presence of hyperthyroidism or hypothyroidism at this time, but     further workup is needed to assess the status of the thyroid gland     itself.  4. Admission for syncopal episode suspicious for possible seizure versus     neuropathy currently evaluated by Neurology.   PLAN:  1. Suggest serum hormonal evaluation of pituitary hormones of prolactin,     ACTH, somatostatin, LH, and FSH.  2. Ordered level of thyroglobulin and evaluate for the presence of     autoimmunities and thyroid microsomal and thyroid anti-thyroglobulin     antibodies.  3. I will monitor those while the patient is at [facility] but will     be glad to see her as an outpatient if necessary.   CLINICAL HISTORY:  [name] is a 76 year old African-American female who  was admitted through the emergency room because of altered mental status.  The patient, who lives alone, was getting ready to go to church over the  weekend and felt weird and did not know where she is.  She had some  headache, felt nauseated.  She did not pass out completely but felt  very  lethargic.  The patient called her daughter who came and took her to the  emergency room.  The patient had been under some amount of stress since her  son died recently from complications of end-stage renal disease.  He was on  dialysis and passed away at age 95.  She was very attached to him and he  lived with her all his life.  The patient has no history of thyroid disease  in the past or a history of goiter, and she does not recall any history of  thyroid disease or goiter in her family.  The patient came into the hospital  and has been monitored and evaluated by Dr. Katrinka Blazing and Dr. Sherrie Mustache.  She also  had neurologic consultation by Dr. Beryle Beams.  The patient had several  laboratory and  diagnostic studies ordered. and in the process she had a  pituitary TSH level drawn which was low with a value of 0.198.  Her free T4  and free T3, however, were both normal.   PAST MEDICAL AND SURGICAL HISTORY:  1. Chronic peptic ulcer disease.  2. Chronic obstructive airway disease.  3. Hypertension.  4. Chronic depression and situational anxiety.  5. History of peripheral vascular disease.  6. History of chronic thrombocytosis.  7. Nonspecific history of coronary artery disease.  8. Status post hemorrhoidectomy, 2004, and cholecystectomy.  9. History of abdominal surgery, ? type, ? laparoscopy or laparotomy.   MEDICATIONS:  1. Remeron 15 mg p.o. q.h.s.  2. Pleatal 100 mg p.o. b.i.d.  3. Hydrocodone 7.5 mg p.r.n.  4. Ambien 10 mg p.o. q.h.s. p.r.n.  5. Avalide 150/12.5 mg p.o. daily.  6. Avinza 90 mg p.o. daily.   ALLERGIES:  ASPIRIN causes hives.   SOCIAL HISTORY:  The patient is a widow.  Her husband passed away from heart  disease in the 90s.  He also had sickle cell crises.  She lives alone.  Her  son passed away in 29-Sep-2022 of this year from chronic renal disease.  He was on  dialysis.  She used to smoke 2 packs per day but quit.  She denies alcohol  intake.  She has no history of  radiation exposure to the neck or chest.  She  used to work for [employer] but had to quit when she got sick.  She  had 7 children, 2 of them passed away.  Her son noted above died from kidney  disease, and she had a daughter who died from breast cancer.  The others are  living and healthy.   FAMILY HISTORY:  The patient is one of 7.  She has 3 brothers and 3 sisters,  with positive history of diabetes, no history of heart disease, stroke, or  thyroid disease, or cancer that she is aware of.  Parents deceased from old  age.   REVIEW OF SYSTEMS:  The patient denies having any dysphagia, no hair loss,  no changes in her voice.  Bowel movement is normal, occasional constipation.  There is no heat or cold intolerance.  Occasionally, she had hot flashes.  She has coughing with some expectoration.  She had some weakness and  numbness in the lower extremities.   EXAM:  The patient was examined in her room.  She was comfortable and in no  acute distress.  VITALS:  Stable.  Blood pressure 120/70, pulse rate in the 80s, respirations  normal __________.  HEAD AND NECK:  There was no exophthalmos, some minimal supraorbital  swelling, puffiness.  No lid lag or lid spasm.  Extraocular movements normal  and intact, visual fields intact.  Neck revealed no palpable goiter or  masses.  Thyroid was very small, soft, about 1 to 1.5 cm in diameter.  There  was no tenderness.  No bruit over the thyroid.  No lymph nodes.  LUNGS:  Diminished breathing sounds with bilateral crackles and rales.  HEART:  Regular rate and rhythm with systolic murmur in the left sternal  border.  ABDOMEN:  Soft, scar in the lower abdomen, no tenderness, rebound, or  guarding.  EXTREMITIES:  Reflexes were diminished bilaterally.  There was no pretibial  edema.   AVAILABLE LABS:  TSH was low, 0.198, done on June 9.  Free T3 and free T4 both normal, done on June 9.  Liver  enzymes.  Glucose 97, sodium 140,  potassium  3.5, BUN 7, serum creatinine 0.7, calcium 8.5.  B-12 level was  high, 1186, drawn on June 9.  CPK was 274 and B-band was 1.9 with a ratio of  0.7.  Troponin was negative.  Heavy metal in the urine was pending.  Autoantibodies, ANA, rheumatoid factor, CRP were pending.  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  MRI of the spine showed  multi-level disk degeneration and  spondylosis with disk protrusion more at C3-4, C4-5.  WBC was 6.9,  hemoglobin 8.4, hematocrit 24.9, platelets 617, all these drawn on June 10.  Blood gases from June 8, pH 7.43, CO2 40, O2 62, base excess 2.7, saturation  is 92 on room air.  Sed rate was high, 74.                                                   Alan Mulder, M.D.    SJM/MEDQ  D:  12/17/2002  T:  12/17/2002  Job:  [id number]   cc:   Elliot Cousin, M.D.  [address]  [place]  [place] [address]  Fax: [phone]   Kofi A. Gerilyn Pilgrim, M.D.  [address], [place]  [place]  [place] [address]  Fax: [phone]

## 2010-11-23 NOTE — Procedures (Signed)
   NAME:  [name], Jessica E                          ACCOUNT NO.:  [id number]   MEDICAL RECORD NO.:  [id number]                   PATIENT TYPE:  INP   LOCATION:  A222                                 FACILITY:  [facility]   PHYSICIAN:  Kofi A. Gerilyn Pilgrim, M.D.              DATE OF BIRTH:  1934-10-08   DATE OF PROCEDURE:  DATE OF DISCHARGE:                                EEG INTERPRETATION   HISTORY OF PRESENT ILLNESS:  This 76 year old lady who presents with  syncopal episode and neurological deficit, now suggests 16 sound recording  is conducted for approximately 20 minutes to assess the posterior devault.  Ten to 11 hertz with attenuation only beta activity seen.  There is beta  activity seen in the frontal areas.  Awake architecture is seen.  Photo  stimulation does not elicit any abnormal responses.  There is no focal  slowing, lateralized slowing, or epileptiform activity seen.   IMPRESSION:  This is a normal recording of the awake state.                                               Kofi A. Gerilyn Pilgrim, M.D.    KAD/MEDQ  D:  12/20/2002  T:  12/20/2002  Job:  [id number]

## 2010-11-23 NOTE — Consult Note (Signed)
[name], [name]                ACCOUNT NO.:  [id number]   MEDICAL RECORD NO.:  [id number]          PATIENT TYPE:  INP   LOCATION:  A215                          FACILITY:  [facility]   PHYSICIAN:  Ladona Horns. Neijstrom, MD  DATE OF BIRTH:  1935/02/23   DATE OF CONSULTATION:  11/07/2006  DATE OF DISCHARGE:                                 CONSULTATION   DIAGNOSIS:  1. Thrombocytosis.  2. Leukocytosis.  3. Recent gastrointestinal bleed leading to partial gastrectomy.  4. Periesophageal tear, repair.  5. Splenectomy at [facility] on October 26, 2006 by Dr. Rise Mu.  6. Pulmonary obstructive lung disease secondary to longstanding      smoking history of a 1-1/2 packs of cigarettes a day times many      years.  7. Congestive heart failure.  8. Hypertension.  9. History of glaucoma.  10.History of cholecystectomy.  11.History of depression.  12.History of hemorrhoidectomy.  13.History of iron deficiency in the past.  14.History of peripheral vascular disease.  15.History of coronary artery disease status post myocardial      infarction years ago with a stent placement at that time.   HISTORY:  I am asked to see [name] because of an elevation in her  platelets over 1,000,000.  Her white count has also been high greater  than 15,000.  She has had a predominance of neutrophils.  She came in  the other day, with shortness of breath.  She was found to be in some  heart failure and was admitted.  Her platelets were noted to be elevated  and I was called in consultation.   She did undergo the above-mentioned surgery, just recently.  Prior to  that her platelets were high in the past; and, indeed, I saw her in the  clinic over the last few years; I believe that the last time was in  March of 2005 when she presented with both microcytic anemia with a  ferritin of 6, no bone marrow, iron stores seen on a bone marrow  aspirate and biopsy, and a very low hemoglobin; and she had reactive  thrombocytosis even at that time.   She also has history of a peripheral neuropathy and myopathy for which  she has seen Dr. Gerilyn Pilgrim in the past.  She also has an allergy to  asthma which gives her rash.   I think that we do need to reevaluate her to make sure that her iron  stores remain intact.  She has a serum iron TIBC which were not  diagnostic at this time.  A ferritin level, I do not believe has been  done, so I will order that.   She states that she is breathing better since being here, but she is  still wheezing, still short of breath, and still has a few rales at the  bases.  She has no obvious adenopathy.  Her midline abdominal incision  is healing nicely.  Bowel sounds are mildly active.  Her heart shows a  tachycardia right around 100-110, but I did not hear a distinct S3  gallop presently.  She has no masses on breast exam she has no adenopathy in any location I  can appreciate.  She does have 2+ pitting edema of the ankles.  Pulses  in her feet are diminished.   I think this lady most likely has reactive thrombocytosis and  leukocytosis status post splenectomy.  There is nothing to treat her  with at this time, but I will check her ferritin level and make sure  that that returns normal since she has been clearly iron deficient in  the past.      Ladona Horns. Mariel Sleet, MD  Electronically Signed     ESN/MEDQ  D:  11/07/2006  T:  11/07/2006  Job:  [id number]   cc:   Erle Crocker, M.D.

## 2010-11-23 NOTE — H&P (Signed)
[facility] ADMISSION   NAME:[name], [name]                       MRN:          [id number]  DATE:04/30/2006                            DOB:          01-10-1935    Ms. Eckstein is referred today, I believe, by Erle Crocker.   The patient has a history of distant myocardial infarction.  She was  recently hospitalized from September 27 to October 2 at [facility]  for question GI blood loss and chronic anemia.  From what I gather, she had  an upper endoscopy and a colonoscopy without a source of bleeding found.  She was placed back on iron.   The patient apparently had some abnormalities in her telemetry while in the  hospital.  These included sinus tachycardia with runs of nonsustained VT.   She had an echo that was normal in 2006.   She was on beta blockers prior to this admission, and the combination of  anemia and stopping her beta blockers probably led to her tachycardia.   The patient was discharged from the hospital.  She had a follow-up 2 D  echocardiogram done April 10, 2006, which was read by Dr. Tenny Craw.  It  essentially was unremarkable with LVH and an EF of 60%.   There was no evidence of previous regional wall motion abnormality.   Her review of systems is remarkable for no significant palpitations, PND,  orthopnea, no syncope.   She has been walking with a walker for 2 years.  She says it is because of  poor circulation but I cannot document that she has ever had PVD.   She is allergic to ASPIRIN and PENICILLIN.  The ASPIRIN allergy involves  swelling and not just GI intolerance.   She does not relate any significant melena.   She is widowed.  She has 7 children.  One of her daughters was with her.  Two of her children have passed.  She is fairly sedentary.  She goes to  church but does not do much else.   MEDICATIONS:  1. Poly-Iron.  2. __________.  3. Protonix.  4.  Tramadol 50 mg.  5. Spiriva.  6. Lasix 40 mg a day.  7. Lopressor 100 mg.  8. Potassium 20 mEq.  9. Travatan eye drops.  10.Remeron.  11.Clonazepam.  12.Nicotine patch 14 mg.   PHYSICAL EXAMINATION:  GENERAL:  She is elderly and chronically ill-  appearing.  VITAL SIGNS:  She has a blood pressure of 110/54, pulse of 70 and regular.  LUNGS:  Clear.  NECK:  Carotids are normal.  There is no lymphadenopathy.  There is no  thyromegaly.  HEENT:  Normal.  There is an S1, S2, with normal heart sounds.  ABDOMEN:  Benign.  EXTREMITIES:  Lower extremities have palpable DP and PTs bilaterally with +1  lower extremity edema.  She has a peripheral neuropathy.   IMPRESSION:  The patient has a distant history of myocardial infarction.  In  talking to her, she never had a heart catheterization and she may  have had  spontaneous lysis of a clot.  She has never had a documented regional wall  motion abnormality, and there is good left ventricular function.  I suspect  her rapid rhythms and nonsustained ventricular tachycardia were strictly  from her anemia.  She is apparently back on beta blockers and I do not think  any other workup is necessary for that.  However, given her age and  ventricular arrhythmias with history of a myocardial infarction, I think it  is reasonable to proceed with an adenosine Myoview study to rule out  ischemic substrate.   I also think that the patient should have ankle brachial indices to assess  her peripheral circulation.  She tells me she is using a walker because of  poor circulation, but I can palpate distal pulses and I suspect she has more  of an arthritic condition and neuropathy than anything else.   Further recommendations will be based on the results of her ABIs and stress  test.            ______________________________  Noralyn Pick. Eden Emms, MD, [facility]      PCN/MedQ  DD:  04/30/2006  DT:  04/30/2006  Job #:  [id number]   cc:   Erle Crocker, M.D.   Osvaldo Shipper, MD

## 2010-11-23 NOTE — H&P (Signed)
NAME:  [name], [name]                          ACCOUNT NO.:  [id number]   MEDICAL RECORD NO.:  [id number]                   PATIENT TYPE:  AMB   LOCATION:  DAY                                  FACILITY:  [facility]   PHYSICIAN:  Leroy C. Katrinka Blazing, M.D.                DATE OF BIRTH:  1935/01/23   DATE OF ADMISSION:  07/16/2002  DATE OF DISCHARGE:                                HISTORY & PHYSICAL   HISTORY OF PRESENT ILLNESS:  [age]-year-old female with history of  hemorrhoidal disease for quite some time.  She has constant swelling in the  perianal area.  She had constipation but her bowel movements are better on  Zelnorm.  She had normal colonoscopy in March of 2002.  The patient has been  advised for some time to have hemorrhoidectomy; she has finally consented to  have the procedure done.   PAST HISTORY:  Coronary artery disease, status post myocardial infarction;  chronic obstructive pulmonary disease; chronic microcytic hypochromic  anemia; peripheral vascular disease; chronic thrombocytosis; peripheral  neuropathy; depression with unresolved grief.  The patient was recently  admitted on December 15th for an upper GI bleed due to gastric ulcer and  ulcer of the ileocecal valve.   MEDICATIONS:  1. Hemocyte Plus daily.  2. Megace two tablespoons daily.  3. Tenormin 50 mg daily.  4. Dyazide 37.5/25 mg daily.  5. Prevacid 30 mg daily.   PREVIOUS SURGERY:  Cholecystectomy.   FAMILY HISTORY:  Family history is positive for arthritis, diabetes mellitus  and hypertension.   SOCIAL HISTORY:  She smokes two packs of cigarettes per day.  She is  medically disabled.  She does not drink or use drugs.   PHYSICAL EXAMINATION:  VITAL SIGNS:  Blood pressure 180/118, pulse 88,  respirations 20.  Weight 146 pounds.  HEENT:  Unremarkable.  NECK:  Neck supple.  No JVD or bruit.  CHEST:  Chest clear to auscultation.  No rales, rubs, rhonchi or wheezes.  HEART:  Regular rate and rhythm  without any murmur, gallop or rub.  ABDOMEN:  Mild epigastric tenderness, normal bowel sounds.  No masses.  RECTAL:  Enlarged internal and external hemorrhoids.  Normal sphincter tone.  No bleeding.  EXTREMITIES:  Good peripheral pulses.  No joint deformity.  No cyanosis,  clubbing or edema.  NEUROLOGIC:  No motor, sensory or cerebellar deficit.  Cranial nerves  intact.  Deep tendon reflexes intact.   IMPRESSION:  1. Hemorrhoidal disease, internal and external.  2. Recurrent peptic ulcer disease, under treatment.  3. Chronic iron deficiency anemia.  4. Coronary artery disease.  5. Chronic obstructive lung disease.  6. Hypertension.  7. Peripheral vascular disease.  8. Peripheral neuropathy.  9. Depression.  10.      Chronic thrombocytosis.   PLAN:  Hemorrhoidectomy.  Dirk Dress. Katrinka Blazing, M.D.    LCS/MEDQ  D:  07/15/2002  T:  07/16/2002  Job:  [id number]

## 2010-11-23 NOTE — Consult Note (Signed)
NAME:  [name], [name]                          ACCOUNT NO.:  [id number]   MEDICAL RECORD NO.:  [id number]                   PATIENT TYPE:  INP   LOCATION:  A222                                 FACILITY:  [facility]   PHYSICIAN:  Kofi A. Gerilyn Pilgrim, M.D.              DATE OF BIRTH:  07-May-1935   DATE OF CONSULTATION:  DATE OF DISCHARGE:                                   CONSULTATION   NEUROLOGICAL CONSULTATION   IMPRESSION:  1. Syncope, etiology unclear with suspicious for possible seizures given the     amnesia and prolonged passing out time.  2. Progressive leg weakness with EMG events of polyneuropathy.  3. Etiology includes perineuropathy syndrome, nutritional deficiency,     vasculitis, and collagen vascular diseases; also chronic inflammatory     polyneuropathy.   RECOMMENDATIONS:  1. Extensive blood testing including RPR, vitamin B12 levels, thiamin, ANA,     RF, homocystine level, methyl malonic acid level, and also amino     fixation.  2. Craniopathic workup including:  Screening for occult malignancy including     mammography, abdominal ultrasound, and chest CT particularly to follow up     the right hilar fullness seen on chest x-ray.  3. Heavy-metal screen of the urine.  4. Electroencephalography. Thus I would make sure doing a trial of amino     globulins to see if she will improve clinically.  5. Physical therapy should evaluate her.  6. DVT prophylaxis to prevent pulmonary embolism is highly suggested.  7. I think that we will repeat her EMG to better characterize her leg     weakness and to assess for any worsening.  8. Suggest Neurontin to help her pain management.   HISTORY:  The patient is a 76 year old African-American lady who apparently  presents with leg weakness which has gotten progressive over the last year,  apparently, at least over the past few months.  She did have an EMG of upper  extremities because of paresthesias, pain, which showed evidence of  moderately severe median neuropathy at the wrist on the right hand side,  ulnar neuropathy on the right; and also mild median neuropathy at the wrist  on the left.  Essentially she had moderately severe carpal tunnel on the  right, mild carpal tunnel on the left and ulnar neuropathy on the right.  There was also evidence of polyneuropathy but a complete study was not done  of the legs.  There was some evidence of conduction block with a 20%  conduction block of the left median, motor response with temporal  dispersion.  Her weakness has gotten worse where she reports difficulty in  standing, currently. She also reports significant pain and burning of the  feet and legs which seems to contribute significantly to her difficulties  ambulating.   She apparently was found on the floor yesterday. She has no recollection of  getting there.  She has mismatch report exactly how she got there.  She does  not recall how long she was there.  There is no reports of oral trauma or  urinary incontinence.   PAST MEDICAL HISTORY:  The past medical history is significant for peptic  ulcer disease, coronary artery disease, chronic obstructive pulmonary  disease, hypertension, and peripheral disease, chronic thrombocytosis and  chronic depression.  See status post hemorrhoidectomy and status post  cholecystectomy.   ADMISSION MEDICATIONS:  1. Remeron.  2. Pletal.  3. Hydrocodone.  4. Ambien.  5. Avalide.  6. ________.   REVIEW OF SYSTEMS:  Problems of chronic insomnia, back pain, leg weakness,  burning, and pain in the feet.  She also apparently reports a weight loss  although she could not quantify how much.   ALLERGIES:  ASPIRIN.   SOCIAL HISTORY:  She does report history of tobacco use.   PHYSICAL EXAMINATION:  VITAL SIGNS:  Apparently had an admission temperature  of 100.2.  She has been essentially afebrile sine then.  Her temperature is  99.2, pulse 112, respirations 20, blood pressure  109/62.  Weight 64  kilograms.  GENERAL:  Exam reveals a lady who is in significant discomfort for pain in  the legs.  She is somewhat tearful at times with the exam.  NECK:  The neck is supple.  LUNGS:  Clear to auscultation bilaterally.  CARDIOVASCULAR:  Exam revealed normal S1, S2.  ABDOMEN:  Soft.  EXTREMITIES:  Does not show any significant edema.  NEUROLOGIC:  Mental status exam shows that she is awake. She is alert. She  conversed fluently and coherently. The cranial nerves II-XII are intact  including the visual fields.  Motor examination: Upper extremities she has  slight loss of muscle bulk in the first interossei on the right with  slightly reduced strength there and slightly reduced handgrip.  The right  side she is relatively normal.  Proximally the deltoid that she has slightly  reduced weakness there at 45.  In the lower extremities she has 4/5 weakness  proximally.  She has a complete foot drop with 0-5 strength with  dorsiflexion.  Plantar flexion is relatively spared.  Inversion is also  impaired, essentially 0-5; right leg seems relatively normal.  Reflexes are  markedly diminished throughout.  She has somewhat preserved triceps reflexes  bilaterally.  Plantar reflexes are both downgoing.  Coordination is  unremarkable.  Sensory examination was mostly unreliable, but really did not  show any clear deficits.  Gait was mostly antalgic and somewhat wide based.   LABORATORIES:  Evaluation showed a platelet count of 616, WBC 8.4,  hemoglobin 9.5.  The chemistries are unremarkable; so is the atrial blood  gas.  CPK 274.                                               Kofi A. Gerilyn Pilgrim, M.D.    KAD/MEDQ  D:  12/15/2002  T:  12/15/2002  Job:  [id number]

## 2010-11-23 NOTE — Group Therapy Note (Signed)
[name], CORY                ACCOUNT NO.:  [id number]   MEDICAL RECORD NO.:  [id number]          PATIENT TYPE:  INP   LOCATION:  A227                          FACILITY:  [facility]   PHYSICIAN:  Margaretmary Dys, M.D.DATE OF BIRTH:  [birth year]   DATE OF PROCEDURE:  04/06/2006  DATE OF DISCHARGE:                                   PROGRESS NOTE   SUBJECTIVE:  The patient feels much better today, says she was able to get  some sleep.  Her home medications were restarted yesterday.  The patient  denies any abdominal pain.  No melenic stools or blood per rectum.  The  patient is scheduled for colonoscopy tomorrow.  Discussed her care with Dr.  Cira Servant.   OBJECTIVE:  Conscious, alert, comfortable, not in acute distress.  Vital  signs: Blood pressure 135/82, pulse 106, respirations 20, T-max 99.4.  Oxygen saturation 92% on room air.  I did notice tachycardia on her vital  signs. When I checked the telemetry monitor, it was mostly sinus  tachycardia.  HEENT:  Normocephalic and atraumatic.  Oral mucosa was moist.  No exudates were noted.  NECK: Supple, no JVD, no lymphadenopathy.  Lungs:  Clear anteriorly bilaterally. Heart:  S1, S2 regular. No S3, S4, gallops, or  rubs.  Abdomen: Soft, nontender.  Bowel sounds are positive.  No masses  palpable.  Extremities: No pitting pedal edema.   LABORATORY DATA:  White blood cell count 9.1, hemoglobin stable at 10.2,  hematocrit 30.7, platelet count 798 with no left shift.  Sodium 144,  potassium 3.8, chloride 108, CO2 30, glucose 114,  BUN 5, creatinine 0.8,  calcium 6.7.   ASSESSMENT AND PLAN:  1. Gastrointestinal bleed with unknown source.  The patient had an upper      endoscopy which was unremarkable.  She is scheduled for total      colonoscopy tomorrow morning.  Overall, she is hemodynamically stable.      Hemoglobin and hematocrit are also stable.  She has no evidence of      active bleeding.  2. Hypertension.  Blood pressure is stable.  We  will continue to hold her      blood pressure medications at this time.  3. Anxiety and insomnia were much better overnight.  The patient's home      medications and anxiolytics have been restarted.  4. Chronic obstructive pulmonary disease is stable with no wheezing.      Continue on Spiriva inhaler and p.r.n. nebulizers.   DISPOSITION:  For colonoscopy tomorrow.  Discussed with Dr. Cira Servant.  The  patient may go home if colonoscopy is unremarkable and hemoglobin and  hematocrit remain stable.      Margaretmary Dys, M.D.  Electronically Signed     AM/MEDQ  D:  04/06/2006  T:  04/06/2006  Job:  [id number]

## 2010-11-23 NOTE — Discharge Summary (Signed)
NAME:  [name], [name]                          ACCOUNT NO.:  [id number]   MEDICAL RECORD NO.:  [id number]                   PATIENT TYPE:  INP   LOCATION:  2018                                 FACILITY:  [facility]   PHYSICIAN:  Melissa L. Ladona Ridgel, MD               DATE OF BIRTH:  [birth year]   DATE OF ADMISSION:  03/10/2004  DATE OF DISCHARGE:  03/15/2004                                 DISCHARGE SUMMARY   PRIMARY CARE PHYSICIAN:  Jerolyn Shin C. Katrinka Blazing, M.D., [facility].   DISCHARGE DIAGNOSES:  1.  Upper gastrointestinal bleed secondary to duodenal ulcer.  2.  Tachycardia possibly secondary to subclinical thyroid toxicosis.  3.  Neuropathy with residual nerve damage to the left leg.  4.  Anxiety and depression.  5.  History of chronic anemia likely related to her underlying peptic ulcer      disease.  6.  Reflux.   DISCHARGE MEDICATIONS:  1.  Xanax 0.5 mg t.i.d. p.r.n.  2.  Ambien 10 mg p.o. q.h.s. p.r.n.  3.  Senna S tablets q.h.s.  4.  Protonix 40 mg p.o. b.i.d.  5.  Avinza 90 mg p.o. q.d.  6.  Pletal 100 mg p.o. b.i.d.  7.  Remeron 15 mg p.o. q.h.s.  8.  Dyazide p.r.n.  9.  Colace 100 mg b.i.d.  10. Multivitamin one tablet p.o. b.i.d.  11. Ferrous sulfate 325 mg p.o. b.i.d.  12. Lortab 7.5/500 mg p.o. q.i.d.  13. Lopressor 25 mg p.o. b.i.d., which is new for her.  14. The patient is to avoid Motrin, aspirin, Aleve and related products.   DIET:  Low cholesterol diet.   SPECIAL INSTRUCTIONS:  The patient is to call her physician if she develops  any chest pain, shortness of breath, dizziness, bright red blood per rectum  or vomiting of blood.   FOLLOW UP:  The patient is to make a follow-up appointment with Dr. Elpidio Anis in 1 week.  A call was placed to Dr. Michaelle Copas office, however, return  call was not made to this physician.  Will attempt to speak with him today.  Dr. Patrecia Pace has agreed to see this patient in 1 week.  The patient is to  call for an appointment  at [phone].   HISTORY OF PRESENT ILLNESS:  The patient is a 76 year old, African-American  female who has a past medical history for GI bleed related to ulcer disease.  She presented to the [facility] with acute onset of hematemesis and  bright red blood per rectum.  Because an ICU bed was not available, the  patient was transported down to [facility] for further care.  In  the ICU, the patient was found to be anemic and slightly hypotensive.  She  complained of abdominal pain and nausea.  She was treated symptomatically  and then underwent EGD under the care of Dr. Anselmo Rod  and noted the  patient had a large duodenal ulcer that was not bleeding at the time of the  EGD.  She therefore was transfused aggressively and treated with Protonix IV  initially b.i.d. and then changed to oral Protonix.  Her diet was slowly  advanced and she tolerated a full diet x48 hours prior to discharge.  During  the course of the hospitalization, it was noted that the patient had  tachycardia initially.  When reversible causes were corrected and the  patient was still found to be tachycardic and on the day of discharge found  to have elevated heart rates in the 120s, a beta-blocker was started and the  patient's case was discussed with a previous specialist.  Dr. Patrecia Pace from  the [facility] had evaluated this patient several months ago  during a previous hospitalization for her tachycardia.  He recommended she  may have a hypothalamic source instead of a thyroid source, and she was not  started on any medication at that time.  I have discussed the case with Dr.  Patrecia Pace and he agrees with the beta-blocker.  At this time, we will not add  Tapazole or PTU dosing because further testing needs to be done to elucidate  the origin of the tachycardia.  He therefore will see this patient in 1 week  as an outpatient.  A also expressed to the patient his primary care  physician  needs to follow this up once I have contacted them by phone.   DISCHARGE PHYSICAL EXAMINATION:  VITAL SIGNS:  On the day of discharge, the  patient's vital signs remained stable.  Her blood pressure was 120/70, pulse  100-120 and decreased down to the low 100s at the actual time of discharge  after receiving Lopressor.  Her temperature was 98.6 with respirations of  20, 98% on room air.  GENERAL:  No acute distress.  HEENT:  Pupils equal round and reactive to light.  Extraocular movements  intact.  Mucous membranes moist.  NECK:  Supple.  No JVD or lymph nodes.  CHEST:  Clear to auscultation.  No rhonchi or wheezes.  CARDIAC:  Regular rate, but tachycardic.  Positive S1, S2, no S3, S4, no  murmurs, rubs or gallops.  ABDOMEN:  Soft, nontender, nondistended with positive bowel sounds.  EXTREMITIES:  Left foot drop with decreased sensation and decreased DTRs.  Pulses are otherwise 2+.  NEUROLOGIC:  The patient is awake, alert and oriented.  Cranial nerves 2-12  grossly intact with the exception of a nerve palsy in the left lower  extremity.   LABORATORY DATA AND X-RAY FINDINGS:  Pertinent laboratory values during  course of this hospitalization revealed a discharge hemoglobin and  hematocrit of 9.8 and 28.2.  The patient was restarted on her iron tablets.  Her white count was 7.4 and platelets were 676.  Her BUN was 6 with a  creatinine of 0.8.  Glucose is slightly elevated at 122.  A hemoglobin A1C  was not completed during the course of this hospital stay and would be  recommended as an outpatient if she continues to have elevated fasting blood  sugars.  Her CLOtest was negative.  The thyroid values noted during this  admission with TSH 0.095 initially with a corresponding T4 of 1.09 and a T3  of 2.5.  Repeated values were undertaken after she was volume resuscitated and the stress of her GI bleed was slightly passed, but still showed a low  TSH of 0.224  with a T4 of 1.27 and a T3 of  3.0.  Urinalysis was negative for  any occult infection.  Urine culture was also negative for any growth.   DISPOSITION:  This patient is deemed stable to follow up with Dr. Katrinka Blazing in 1  week, as well as Dr. Patrecia Pace for outpatient treatment of possible  subclinical thyroid toxicosis versus hypothalamic pituitary origin for her  tachycardia.                                                Melissa L. Ladona Ridgel, MD    MLT/MEDQ  D:  03/16/2004  T:  03/17/2004  Job:  [id number]   cc:   Dirk Dress. Katrinka Blazing, M.D.  [address]  [place]  [place] [address]  Fax: [phone]

## 2010-11-23 NOTE — H&P (Signed)
NAME:  [name], [name]                          ACCOUNT NO.:  [id number]   MEDICAL RECORD NO.:  [id number]                   PATIENT TYPE:  INP   LOCATION:  A306                                 FACILITY:  [facility]   PHYSICIAN:  Jerolyn Shin C. Katrinka Blazing, M.D.                DATE OF BIRTH:  03-19-1935   DATE OF ADMISSION:  06/21/2002  DATE OF DISCHARGE:                                HISTORY & PHYSICAL   HISTORY OF PRESENT ILLNESS:  This is a 76 year old female admitted through  the emergency room for GI bleeding.  The patient gives a history of having  bright red rectal bleeding with bowel movements late yesterday evening.  She  had three bowel movements on the morning of admission.  She was seen in the  emergency room where on evaluation she was noted to have dark blood on the  examining finger.  An NG tube was placed as she had a large volume of coffee  ground material.  It was felt that she has a recurrent GI bleed, and she was  admitted for treatment.  The patient has a long history of peptic ulcer  disease, dating back to early 1989.  She had active ulcers, which were  treated with combination therapy for positive H. pylori in 2001.  She was  admitted in March of 2002 and had viral gastritis on EGD but no active  bleeding.  Colonoscopy had been done in 1997, 2001, and 2002.  These studies  have been normal except for internal and external hemorrhoids.  The patient  has a chronic iron deficiency with decreased iron stores and chronically low  saturation.  She had a bone marrow aspirate in July of 2003, which was  significant only in that she had absent iron.  She has been on chronic iron  therapy since that time.  The contributing factor is probably related to the  recent institution of Indocin for severe arthritic pain in October.  She had  been treated with multiple analgesics and nonsteroidals without any  improvement.  She had significant improvement with Indocin at 75 mg twice a  day.  The  plan was to treat her for two months and then stop it because of  the potential of Indocin exacerbating her peptic ulcer disease.  She has  been on treatment for six weeks.  It is felt that she has recurrent ulcer  disease related to nonsteroidal therapy, and this will be withdrawn.   PAST MEDICAL HISTORY:  She has a history of coronary artery disease and had  a myocardial infarction in 1992.  She has been symptomatic since that time.  Other problems include chronic obstructive lung disease, which is  progressive.   SOCIAL HISTORY:  She smokes one to two packs of cigarettes per day since age  56.  She has hypertension, which has been fairly well-controlled.  She has  some mild  peripheral vascular disease, but her ankle-brachial index is 0.9.  She has chronic thrombocytosis with platelet counts in the 800,000 range.  She also has progressive numbness of her feet and lower legs, but this has  been present for at least five years.  She had the recent onset of perianal  pain and pressure sensation.  This has been worse for five months.  Examination has revealed no abnormality except for hemorrhoids.  She has  depression with unresolved grief.  Her mother died a few months back, and  she has been unable to accept this.  Her mother was about 78 years old.   PRESENT MEDICATIONS:  1. Hemocyte plus 2 q.d.  2. Megace 2 tablespoons q.d.  3. Tenormin 50 mg q.d.  4. Indocin-SR 75 mg b.i.d.  5. Dyazide 37.5/25 q.d.  6. Prevacid 30 mg q.d.  7. She had a recent prednisone Dosepak.  8. ________ b.i.d.  9. Pletal 100 mg b.i.d.  10.      Lortab 7.5/500 q.i.d.  11.      Micardis HCT 80/12.5 q.d.  12.      Xanax 0.5 mg t.i.d.   REVIEW OF SYSTEMS:  Positive for poor appetite, chronic shoulder and back  pain due to arthritic changes, chronic fatigue, and insomnia.   PAST SURGICAL HISTORY:  Cholecystectomy.   FAMILY HISTORY:  Positive for arthritis, diabetes mellitus, and  hypertension.   PHYSICAL  EXAMINATION:  GENERAL:  She is a pleasant female in no acute  distress.  VITAL SIGNS:  Stable.  Blood pressure 130/80, pulse 68, respirations 18,  weight 151 pounds.  HEENT: Unremarkable except for poor dentition.  NECK:  Supple.  No JVD, bruit, adenopathy, or thyromegaly.  CHEST:  A few coarse rhonchi.  No wheezes.  No rales.  HEART:  Regular rate and rhythm. No murmur, gallop, or rub.  ABDOMEN:  Soft and nontender.  Normal bowel sounds.  No left lower quadrant  or suprapubic tenderness.  No palpable masses.  EXTREMITIES:  Unremarkable.  No cyanosis, clubbing, or edema.  Good  peripheral pulses by palpation.  NEUROLOGIC:  No motor, sensory, or cerebellar deficit.  Cranial nerves  intact.   IMPRESSION:  1. Upper gastrointestinal bleeding with anemia.  2. Recurrent peptic ulcer disease, probably exacerbated by recent treatment     with Indocin.  3. Chronic iron-deficiency anemia.  4. Coronary artery disease, stable.  5. Chronic obstructive lung disease, progressive, on heavy cigarette use.  6. Hypertension, controlled.  7. Mild peripheral vascular disease.  8. Chronic numbness of feet and legs, suggestive of peripheral neuropathy.  9. Depression with unresolved grief.  10.      Chronic thrombocytosis.  11.      Recurrent perianal pain and pressure, etiology undetermined, but     with associated hemorrhoids.   PLAN:  The patient will be admitted and transfused.  She will undergo a  bowel prep, and as long as she is not having a brisk bleed, will try to get  a bowel prep completed so that we can do EGD and colonoscopy.  This will be  done on Wednesday.  Indocin will be discontinued.  She will be treated with  Carafate and IV Pepcid for now.  We will probably increase her dose of  Prevacid to 30 mg twice a day after the acute phase.  She will be continued  on her baseline medications.  Dirk Dress. Katrinka Blazing, M.D.   LCS/MEDQ  D:  06/21/2002   T:  06/21/2002  Job:  [id number]

## 2010-11-23 NOTE — H&P (Signed)
NAMEPRACHI, OFTEDAHL                ACCOUNT NO.:  [id number]   MEDICAL RECORD NO.:  [id number]          PATIENT TYPE:  EMS   LOCATION:  ED                            FACILITY:  [facility]   PHYSICIAN:  Osvaldo Shipper, MD     DATE OF BIRTH:  1935/02/11   DATE OF ADMISSION:  04/03/2006  DATE OF DISCHARGE:  LH                                HISTORY & PHYSICAL   PRIMARY CARE PHYSICIAN:  Dr. Erle Crocker.   ADMITTING DIAGNOSES:  1. Severe anemia.  2. Possible gastrointestinal bleed.  3. Abdominal pain of unclear etiology.  4. History of coronary artery disease.  5. Osteoarthritis.  6. History of gastrointestinal bleeding in the past with the previous      episode back in November, 2006.  7. History of hemorrhagic gastritis.  8. History of cholecystectomy, per patient.   CHIEF COMPLAINT:  Abdominal pain.   HISTORY OF PRESENT ILLNESS:  Patient is a 76 year old African-American  female with the medical problems, as mentioned above, who was feeling well  until this past weekend when she developed right-sided abdominal pain.  The  patient describes the pain as being a dull pain, sometimes sharp, located in  the right flank area.  The pain was radiating to the front side.  The pain  had an off and on feature.  It was getting worse with any movement and with  eating meals.  Relieving factor was pain medication that the patient was  trying to take.  Patient denied any nausea or vomiting along with her  symptoms.  Denied any diarrhea.  Denied any dysuria or hematuria.  Denied  any fevers or chills at home.  Denied any melena or hematochezia at home.  She did mentioned that she has been having symptoms suggestive of fecal  incontinence for the past one month.   Patient also gives a history of numbness in her hands and her legs which  also started this past weekend.  She says she has had these symptoms in the  past as well; however, she has not noticed any focal weakness.  No history  of any  syncopal episodes.  No chest pain, no shortness of breath.  No  abdominal distention.   MEDICATIONS AT HOME:  1. Plavix 75 mg once daily.  2. Potassium twice daily.  3. Tramadol acetaminophen as needed.  4. Multivitamins once a day.  5. MiraLax.  6. Zolpidem 10 mg at bedtime.  7. Alprazolam 0.5 mg three times a day.  8. Alphagan eye drops.  9. Furosemide 40 mg b.i.d.  10.Mirtazapine 30 mg at bedtime.  11.Travatan 1 drop each eye once a day.  12.Metoprolol 100 mg b.i.d.  13.Avapro 200 mg once a day.  14.Cymbalta 60 mg b.i.d.  15.Spiriva inhaler 1 puff daily.   ALLERGIES:  PENICILLIN and ASPIRIN, both of which cause hives.   PAST MEDICAL HISTORY:  Includes history of coronary artery disease, status  post MI about 20 years ago, for which she had stent placements.  A history  of, she mentions, cholecystectomy about three years ago.  A history  of  asthma, cataracts, anemia, gallstones, hypertension, peptic ulcer disease, a  history of GI bleeding in the past.  She also has demyelinating disease in  her brain, for which she is supposed to be following with Dr. Gerilyn Pilgrim.  History of depression is also present.  She has had more than one episode of  upper GI bleed.   SOCIAL HISTORY:  Lives in [place].  Lives alone.  Uses a walker to ambulate.  Has a 50-pack-year history of smoking and continues to smoke one pack of  cigarettes a day.  No alcohol use.  No illicit drug use.   FAMILY HISTORY:  Significant for hypertension, diabetes, kidney disease,  breast cancer, and cervical cancers.   REVIEW OF SYSTEMS:  Please review H&P for partial review of systems and a 10-  point review of systems was otherwise unremarkable.   PHYSICAL EXAMINATION:  VITAL SIGNS:  Temperature 99.2, blood pressure  initially 80/46, staying in the 80s mostly, sometimes going up to 100/55.  Heart rate in the 70s.  Regular.  Respiratory rate is 18.  Saturation 97% on  room air.  GENERAL:  This is an elderly female,  anxious but in no distress.  HEENT:  There is no pallor, no icterus.  Oral mucous membranes are moist.  No oral lesions are noted.  NECK:  Soft and supple.  No thyromegaly is appreciated.  LUNGS:  A few crackles at the left base, otherwise mostly clear to  auscultation.  CARDIOVASCULAR:  S1 and S2 is normal.  Regular.  No S3 or S4.  No rubs, no  murmurs are appreciated.  ABDOMEN:  Soft.  No bruits are heard.  Bowel sounds are present.  No mass or  organomegaly is present.  There is some vague, diffuse tenderness but no  rebound, rigidity, or guarding.  Flank tenderness present but no CVA  tenderness.  RECTAL:  Done by ED physician.  Brown stool which was heme positive.  EXTREMITIES:  Without edema.  Peripheral pulses are poor but palpable.  NEUROLOGIC:  Patient is alert and oriented x3.  No focal neurological  deficits are appreciated.  Cursory sensory exam revealed no deficits either.   LAB DATA:  Her white count is 8.6; however, hemoglobin is 7.3 with an MCV of  82.  Platelet count is 965,000.  Differential on the white count is normal.  RBC morphology shows polychromasia and hyopochromia.  Her CMET is completely  unremarkable except for an albumin of 3.2.  Lipase was 37.  UA was negative  for infection.   Patient also had a CAT scan of her abdomen and pelvis without contrast,  which showed no urinary calculus.  A hiatal hernia was noted.  Appendix was  seen to be normal.  Degenerative disk disease of the spine was noted.   IMPRESSION:  This is a 76 year old African-American female with medical  problems, as stated above, presents with abdominal pain, weakness, and is  found to have severe anemia and heme positive stools.  The patient likely  has had a possible slow GI bleed.  I do not think anything has happened  recently, considering her history and presentation.  Her abdominal pain is kind of vague.  The history partly suggested urinary calculus, although her  UA, CT, and  clinical exam did not reveal this.  The abdominal aorta looked  okay on CAT scan without evidence of bleeding or aneurysm.  Her low blood  pressure is concerning also, which could be because of her  blood loss,  although she denied any recent blood loss.  This could also be exacerbated  by her antihypertensives.   PLAN:  1. Severe anemia:  We will check iron profile studies.  Because she has a      history of coronary artery disease, we will type and screen her and      transfuse her with 3 units of blood to keep her hemoglobin above 10.  2. Possible gastrointestinal bleed:  We will have a gastroenterologist      consult on this individual tomorrow.  If she starts having active      bleeding, we will call them STAT.  I will give her Protonix IV b.i.d.      We will hold all antiplatelet agents for now.  Transfusion will be      initiated, as mentioned above.  3. Hypertension:  Will withhold her antihypertensive agents for now, give      her some IV fluids, give her the blood.  There is no evidence for      infection or sepsis at this time.  We will closely monitor this      patient.  4. Numbness:  She does have evidence for demyelinating disease on her MRI      from 2004.  The patient could have MS.  She will need to follow up with      Dr. Gerilyn Pilgrim.  I do not see any evidence for any acute neurologic      deficits.  5. Tobacco abuse:  We will give her some nicotine as well.  6. DVT prophylaxis will be initiated with the help of Flowtrons.  7. Patient is a full code at this time.   Further management decision will be based on results of initial testing and  patient's response to treatment.      Osvaldo Shipper, MD  Electronically Signed     GK/MEDQ  D:  04/03/2006  T:  04/03/2006  Job:  [id number]   cc:   Kassie Mends, M.D.  [address]  [place] , [place] [address]   Dr. Manfred Arch

## 2010-11-23 NOTE — Consult Note (Signed)
NAME:  [name], MORRISETTE                          ACCOUNT NO.:  [id number]   MEDICAL RECORD NO.:  [id number]                   PATIENT TYPE:  INP   LOCATION:  A222                                 FACILITY:  [facility]   PHYSICIAN:  Ladona Horns. Neijstrom, MD               DATE OF BIRTH:  Mar 31, 1935   DATE OF CONSULTATION:  12/27/2002  DATE OF DISCHARGE:                                   CONSULTATION   DIAGNOSES:  1. Anemia with guaiac positive stools and a prior history of microcytic     anemia secondary to iron-deficiency, as well as a possible element of     anemia of chronic disease.  2. Thrombocytosis with mild dysmegakaryopoiesis.  3. Cholecystectomy in March 2003.  4. Chronic depression.  5. Chronic obstructive pulmonary disease, having stopped smoking several     months ago.  She states with recent infection of her lungs documented by     chest x-ray and characterized by fever.  6. History of myocardial infarction in 1990.  7. History of gastric ulcer disease documented by esophagogastroduodenoscopy     in November 2001, as well as a distal esophageal ulcer documented at the     same time.  8. History of hypertension.  9. History of peripheral vascular disease with numbness and tingling in the     glove stocking distribution consistent with a peripheral neuropathy.  10.      Degenerative joint disease.  11.      History of negative colonoscopy and esophagogastroduodenoscopy in     March 2002.  12.      History of allergy to aspirin which gives her a rash.   HISTORY OF PRESENT ILLNESS:  Riyan was admitted with a fever, anemia, a  ferritin level which was actually normal, but in the setting of an acute  infection.  She also had a high sedimentation rate of 74.  The ferritin  could also be an acute phase reactant.  It was in the low 50s.  Her iron  studies are certainly consistent with a possible anemia of chronic disease,  though when I first saw her in 2002, she had a ferritin level  of 6, serum  iron of 11, TIBC of 331, a percent saturation of 3%.  Her B12 and folic acid  levels back then were normal, and they remain normal now, in fact, they are  elevated both of them.  We did a bone marrow biopsy on this young lady on  January 14, 2002.  We did cytogenetics which were normal.  We did the routine  as well as flow cytometry, they were also normal, other then the bone marrow  which showed absence of iron stores.  She also had some mild  dysmegakaryopoiesis and that is a nonspecific finding.   She has had guaiac positive stools documented in her chart, and she is  scheduled for EGD  and colonoscopy this Wednesday.   She was taking Hemocyte Plus, and she had stopped that.  We last saw her in  the clinic on April 02, 2002, at which time her hemoglobin was 11.5 g,  white count 7800, platelets 731,000.  We had gotten her hemoglobin up to  12.4 g, from her low of 8.4 g in August 2002, at which time her ferritin  level was 6.  We had gotten her ferritin up to a high of 43 prior to her to  stopping in to see [place] at the clinic.   She was supposed to come back and see me in November, but she has not shown  up.   She states that she has stopped smoking altogether.   PHYSICAL EXAMINATION:  GENERAL:  She is in no acute distress.  SKIN:  Warm and dry to the touch.  VITAL SIGNS:  She is afebrile.  Her blood pressure is 138/70, respirations  16 and unlabored, pulse is right around 110 and regular.  HEART:  No murmurs, rubs, or gallops at this time.  Tachycardia.  LUNGS:  Clear at this time, but she did have diminished breath sounds.  I  did not detect any adenopathy in any location.  HEENT:  Her tongue was normal and in the midline.  Posterior papillae were  very healthy in appearance.  BREASTS:  Negative bilaterally.  ABDOMEN:  No hepatosplenomegaly or masses.  EXTREMITIES:  She had decreased posterior tibialis pulses, but dorsalis  pedis pulses were 2+ and symmetrical, and  she had no peripheral edema at  this time.   I do think there may well be an element of anemia of chronic disease in this  lady at this time, but certainly we can not completely exclude iron-  deficiency once again.   She will be given an unit of blood according to the chart, and she will have  EGD and colonoscopy with which I agree.  I think she needs to be back on her  Hemocyte Plus which she has stopped, and I need to see her in followup in  the clinic.                                               Ladona Horns. Mariel Sleet, MD    ESN/MEDQ  D:  12/27/2002  T:  12/28/2002  Job:  [id number]   cc:   Dirk Dress. Katrinka Blazing, M.D.  [address]  [place]  [place] [address]  Fax: [phone]

## 2010-11-23 NOTE — Consult Note (Signed)
NAME:  [name], [name]                          ACCOUNT NO.:  [id number]   MEDICAL RECORD NO.:  [id number]                   PATIENT TYPE:  INP   LOCATION:  2315                                 FACILITY:  [facility]   PHYSICIAN:  Anselmo Rod, M.D.               DATE OF BIRTH:  [birth year]   DATE OF CONSULTATION:  DATE OF DISCHARGE:                                   CONSULTATION   GI CONSULTATION   REASON FOR CONSULTATION:  Hematemesis with bloody stool and severe anemia  (hemoglobin down to 7.7 grams per deciliter) and history of ulcer disease in  a 76 year old African-American female with multiple medical problems.   ASSESSMENT:  1.  Severe anemia hemoglobin down to 7.7 grams per deciliter and a recent      history of hematemesis (yesterday) rule out recurrent ulcer disease,      Dieulafoy lesion, Mallory-Weiss tear, etcetera, the patients BUN is 44.  2.  Leukocytosis with white count up to 23.3 K rule out sepsis syndrome,      cultures are pending.  3.  Large hiatal hernia seen on x-rays and a history of gastroesophageal      reflux disease on Prevacid.  4.  Chronic constipation on Senokot.  5.  History of osteoarthritis,but denies nonsteroidal use.  6.  History of depression and anxiety on Xanax and Remeron.  7.  History of hemorrhoidectomy in the past.  8.  Questionable history of cholecystectomy.  9.  History of myopathy and polyneuropathy, (details not available).  10. Hypertension on Avalide and Dyazide.  11. Chronic obstructive pulmonary disease with history of tobacco abuse for      several years.   RECOMMENDATIONS:  1.  Continue to keep the patient n.p.o. for now.  2.  Emergent EGD today.  3.  Transfuse 2 units of blood now as planned by Dr. Ladona Ridgel, and check a cbc      thereafter.  4.  Continue serial CBC's and transfuse to keep the hemoglobin around 9-10      grams per deciliter.  5.  There is no need to guaiac all stools in case of obvious      gastrointestinal  bleed.  6.  Agree with intravenous Protonix.   DISCUSSION:  Ms. [name] is a 76 year old African-American female with  the above-mentioned medical problems who was in her usual state of health,  yesterday went out shopping, came home and had several bouts of hematemesis  by a passage of bloody stools on two occasions.  She denies any associated  abdominal pain, fever, chills, or rigors.  There is no prior history of  melena.  She says she was diagnosed with an ulcer in the past, but did not  have any bleeding at the time.  The patient was at the [facility] Emergency  Room, but Dr. Raiford Simmonds was not called for consultation, according to Dr.  Oswaldo Done  Cheek there were no ICU beds available and therefore the  gastroenterologist chose not to do any workup and transferred the patient to  [facility] instead.  No GI intervention was undertaken at [facility], the patient was transferred by [facility] to the [facility] and Dr. Ladona Ridgel contacted me for a GI consult.  The  patient seemed somewhat irritable and does not want to offer much details  about her history, most of the details have been procured from her chart and  in discussion with the nurse.   PAST MEDICAL HISTORY:  See list above.   ALLERGIES:  ASPIRIN AND PENICILLIN.   MEDICATIONS AT HOME:  1.  Xanax.  2.  Prevacid.  3.  Senna.  4.  Dyazide.  5.  Multivitamin.  6.  Lortab.  7.  Avalide.  8.  Ambien.  9.  Avandia.  10. Pletal.  11. Remeron.   SOCIAL HISTORY:  She is tired, she smokes a pack of cigarettes per day and  has been doing this for several years.  She denies the use of alcohol or  illicit street drugs.   FAMILY HISTORY:  Not clear.  The reason for the demise of her parents is not  known to her.  She has a niece with lung cancer.  There is no known family  history of colon or gastric CA.   PHYSICAL EXAMINATION:  GENERAL:  Reveals an older African-American female  laying  comfortably in bed in no acute distress, but somewhat irritable.  Temperature of 98.8, blood pressure 120/70, pulse 78 per minute, respiratory  rate of 20 per minute.  HEENT:  AT/[place], PERRLA.  Face is symmetric.  Preserved oropharyngeal mucosa  without exudate.  NECK:  Supple.  No lymphadenopathy appreciated.  No JVD present.  CHEST:  Clear to auscultation, S1, S2 regular, no murmur, rub or gallop,  rales, rhonchi or wheezing.  ABDOMEN:  Soft, nondistended, nontender with normal bowel sounds, no  hepatosplenomegaly appreciated.  No masses palpable.  RECTAL:  Examination reveals moderate sphincter tone with old heme on  examining finger, no masses palpable.   LABORATORY DATA:  Evaluation revealed a hemoglobin of 7.7 grams per  deciliter with hematocrit of 21.7, white count 23.3 K and platelets 665K,  82% polys, sodium 144, potassium 3.8, chloride 114, CO2 22, BUN 44,  creatinine 1.1, LFTs were normal.   PLAN:  Are as above.  Further recommendations will be made in followup.                                               Anselmo Rod, M.D.    JNM/MEDQ  D:  03/10/2004  T:  03/11/2004  Job:  [id number]   cc:   Melissa L. Ladona Ridgel, MD  Fax: [phone]   R. Roetta Sessions, M.D.  [address]  [place]  [place] [address]  Fax: [phone]   Dirk Dress. Katrinka Blazing, M.D.  [address]  [place]  [place] [address]  Fax: [phone]

## 2010-11-23 NOTE — H&P (Signed)
[name], [name]                ACCOUNT NO.:  [id number]   MEDICAL RECORD NO.:  [id number]          PATIENT TYPE:  INP   LOCATION:  A203                          FACILITY:  [facility]   PHYSICIAN:  Skeet Latch, DO    DATE OF BIRTH:  1935/01/01   DATE OF ADMISSION:  10/15/2006  DATE OF DISCHARGE:  LH                              HISTORY & PHYSICAL   PRIMARY CARE PHYSICIAN:  Erle Crocker, M.D.   CHIEF COMPLAINT:  Vomiting blood and shortness of breath.   HISTORY OF PRESENT ILLNESS:  This is a 76 year old, African-American  female with multiple medical problems who states that, as of yesterday,  she began having episodes of hemoptysis.  The patient states that this  episode started yesterday at home, and she has had multiple episodes of  this and went to her primary care physician's office yesterday and  received injections, nebulizer treatments and some prescription  medications.  The patient states she has been having some associated  epigastric pain also with this and the pain has not subsided.  She  describes it as a dull pain that is occasionally sharp and is located in  her epigastric region.  At this time the patient denies any nausea or  vomiting on my exam.  She denies any diarrhea or chest pain per se.  She  denies any rectal bleeding or dysuria, and denies any bowel movements  for the last few days.  The patient had a previous episode of possible  GI bleed back in September 2007, where she was seen by GI.  The patient,  at that time, had a colonoscopy performed which showed small internal  hemorrhoids, otherwise normal colon with no evidence of polyps, masses  or inflammatory changes seen.  At that time the patient was transfused 2  units of blood also.  She also had an EGD done, April 04, 2006,  which showed moderate size hiatal hernia with no active ulcerations, but  did show erosive antral gastritis.  The patient was also found to have  sinus tachycardia and two  rounds of nonsutained V-tach.  The patient was  found not to be on her beta blockers at that time because of her low  blood pressure.  She was maintained on her beta blockers and recovered.   PAST MEDICAL HISTORY:  1. Coronary artery disease status post MI approximately 20 years ago.  2. Peptic ulcer disease.  3. Asthma.  4. Hypertension.  5. Chronic anemia.  6. Cataract.  7. As stated, a previous GI bleed in the past.  8. There is a note stating she has a demyelinating disease in her      brain which she was seeing Dr. Gerilyn Pilgrim for.   PAST SURGICAL HISTORY:  1. Cataract surgery.  2. Cholecystectomy.   ALLERGIES:  1. PENICILLIN.  2. ASPIRIN CAUSES HIVES.   SOCIAL HISTORY:  Two pack smoker for over 50 years.  She denies any  alcohol or illicit drug use.  She lives alone and lives in [place].   FAMILY HISTORY:  Positive for hypertension, diabetes, kidney disease,  breast cancer, cervical cancer.   REVIEW OF SYSTEMS:  GI:  Positive for hemoptysis and abdominal pain.  CARDIOVASCULAR:  Denies any chest pain.  RESPIRATORY:  Positive for some  wheezing, slight shortness of breath.  All other review of systems are  unremarkable.   PHYSICAL EXAMINATION:  VITAL SIGNS:  Temperature 98.6, respirations 22,  pulse 116, blood pressure 134/88.  GENERAL:  She is awake, alert and oriented times three.  She is in no  acute distress.  The patient is cooperative.  HEENT:  No scleral icterus.  Mucous membranes are moist.  Head is  atraumatic and normocephalic.  No thyromegaly or neck pain appreciated.  Neck was soft and supple.  LUNGS:  Scattered wheezing in bilateral bases.  No rhonchi or rales  appreciated.  CARDIOVASCULAR:  S1-S2.  She is tachycardic.  Regular rhythm.  No rubs,  gallops or murmurs appreciated.  ABDOMEN:  Obese, soft, positive epigastric pain on palpation, positive  bowel sounds, no masses appreciated, no rebound, rigidity or guarding  appreciated.  EXTREMITIES:  No  clubbing, cyanosis or edema appreciated.  NEUROLOGIC:  She is alert and oriented times three.  Cranial nerves II-  XII are grossly intact.  The patient has some decreased sensation in  bilateral feet.   LABORATORY DATA:  White blood cell count 9.9, hemoglobin 8.3, hematocrit  24.7, platelet count 684.  INR is 1.0.  D-dimer 1.22.  PTT is 33.  Sodium 137, potassium 3.5, chloride 102, CO2 is 28, glucose 182, BUN 13,  creatinine 0.77.  AST 18, ALT 12.  Troponin is less than 0.05.  BNP is  less than 30.   ASSESSMENT:  This is a 76 year old, African-American female who presents  with hemoptysis and abdominal pain.  The patient has a history of anemia  and gastrointestinal bleed with heme positive stools in the past.   PLAN:  1. For upper gastrointestinal bleed, we will get a Gastroenterology      consult.  The patient will not receive any anticoagulants of      nonsteroidal anti-inflammatory drugs.  The patient will be typed      and screened for 2 units of packed red blood cells and transfused      if hemoglobin is less than 8.3 on next blood draw.  The patient      will also be given Protonix 40 mg intravenously b.i.d.  2. For anemia we would get an iron panel, but keep her on her home      iron dose at this time.  3. For tachycardia the patient has not received her beta blockers and      we will put the patient back on her home metoprolol and see if this      controls her tachycardia at this time.  I will also increase her      intravenous fluids.  4. Her hypertension seems to be stable at this time.  We will maintain      the patient on her home medications.  5. For tobacco abuse we will put the patient on a nicotine patch and      she received counseling regarding her tobacco abuse.  6. The patient's deep venous thrombosis prophylaxis will be maintained      with TED hose and sequential compression device stockings.   THE PATIENT IS A FULL CODE.      Skeet Latch,  DO Electronically Signed     SM/MEDQ  D:  10/15/2006  T:  10/16/2006  Job:  [id number]   cc:   Erle Crocker, M.D.

## 2010-11-23 NOTE — Op Note (Signed)
[name], [name]                ACCOUNT NO.:  [id number]   MEDICAL RECORD NO.:  [id number]          PATIENT TYPE:  INP   LOCATION:  A227                          FACILITY:  [facility]   PHYSICIAN:  Kassie Mends, M.D.      DATE OF BIRTH:  08-Dec-1934   DATE OF PROCEDURE:  04/07/2006  DATE OF DISCHARGE:                                 OPERATIVE REPORT   PROCEDURE:  Colonoscopy.   INDICATIONS FOR EXAM:  [name] is a 76 year old female who presents with  iron-deficiency anemia.  Her ferritin is 9, and her hemoglobin is 7.3 on  presentation to the emergency department.   FINDINGS:  Small internal hemorrhoids, otherwise normal colon without  evidence of polyps, masses, inflammatory changes, or vascular ectasias seen.  No diverticula evident.   RECOMMENDATIONS:  1. Schedule capsule endoscopy as outpatient within one week.  2. Outpatient visit with Dr. Cira Servant in three weeks.  3. High-fiber diet.  4. Screening colonoscopy in 10 years.  5. Continue Nu-Iron twice a day as an outpatient.   MEDICATIONS:  1. Demerol 75 mg IV.  2. Versed 5 mg IV.   PROCEDURAL TECHNIQUE:  Physical exam was performed, and informed consent was  obtained per the patient after explaining the risks, benefits and  alternatives to the procedure.  Patient was connected to the monitor and  placed in the left lateral decubitus position.  Continuous oxygen was  provided by nasal cannula, and IV medicine administered through an  indwelling cannula.  After administration of sedation and rectal exam, the  patient's rectum was intubated, and scope was advanced under direct  visualization to the cecum.  The scope was subsequently removed slowly by  carefully examining the color, texture, anatomy, and integrity of the mucosa  on the way out.  The patient was recovered in endoscopy suite and discharged  to the floor in satisfactory condition.      Kassie Mends, M.D.  Electronically Signed     SM/MEDQ  D:  04/07/2006  T:   04/08/2006  Job:  [id number]

## 2010-11-23 NOTE — Procedures (Signed)
[name], [name]                ACCOUNT NO.:  [id number]   MEDICAL RECORD NO.:  [id number]          PATIENT TYPE:  OUT   LOCATION:  RAD                           FACILITY:  [facility]   PHYSICIAN:  Pricilla Riffle, MD, FACCDATE OF BIRTH:  1934/08/09   DATE OF PROCEDURE:  04/10/2006  DATE OF DISCHARGE:                                  ECHOCARDIOGRAM   REFERRING PHYSICIAN:  Osvaldo Shipper, M.D.   TEST INDICATIONS:  A 76 year old with history of MI in 1990, also  hypertension.  Test to evaluate left ventricular function, complaining of  chest pain.   2D ECHOCARDIOGRAM WITH ECHO DOPPLER:  Left ventricular normal in size and  diastolic dementia, 42 mm.  The interventricular septum was grossly normal.  Posterior wall was mildly thickened at 12 mm.   Left atrium is mildly dilated.  Right atrium and right ventricle are normal.   The aortic valve is mildly thickened.  There is no insufficiency.  Mitral  valve is mildly thickened with mild annular calcification.  There is trace  insufficiency.  Pulmonic valve is normal with no insufficiency.  Tricuspid  valve is normal with trace insufficiency.   Overall left ventricular systolic function is normal with an left  ventricular function of approximately 60%.  Right ventricular function is  normal.   No pericardial fusion is seen.           ______________________________  Pricilla Riffle, MD, [facility]     PVR/MEDQ  D:  04/10/2006  T:  04/11/2006  Job:  [id number]   cc:   Osvaldo Shipper, MD   Valla Leaver, Ph.D.

## 2010-11-23 NOTE — H&P (Signed)
NAME:  [name], [name]                          ACCOUNT NO.:  [id number]   MEDICAL RECORD NO.:  [id number]                   PATIENT TYPE:  INP   LOCATION:  A222                                 FACILITY:  [facility]   PHYSICIAN:  Jerolyn Shin C. Katrinka Blazing, M.D.                DATE OF BIRTH:  16-Oct-1934   DATE OF ADMISSION:  12/14/2002  DATE OF DISCHARGE:                                HISTORY & PHYSICAL   HISTORY OF PRESENT ILLNESS:  The patient is a 76 year old female admitted  from the emergency room for evaluation of altered mental status, new onset  of left peroneal nerve palsy, mild dehydration.  The patient has had a  course of deteriorating status.  She has a history of falling at home on  Saturday which was three days prior to admission. She states that she does  not know how long she was on the floor.  She states she was in bed, and she  remembers going to sleep, but then awaking on  the floor. She had to move  and crawl around on the floor for an unknown length of time before she could  get back in bed.  After she got back in bed, she states that she was sore  but otherwise felt fine, and she went to sleep. She has had diffuse body  pain over the past  three days.  She felt weaker today.  She states that she  has had increasing difficulty walking, and she is dragging her left leg more  than she has in the past. She cannot stand or walk without the assistance of  her walker.   The patient was evaluated in the emergency room, and the only abnormal sign  is that she had a low-grade temperature of 100 degrees and that she has left  foot drop.  No other acute findings are noted.  These are new findings, and  she is admitted for treatment.   PAST MEDICAL HISTORY:  She has been developing peripheral neuropathy and has  had increased paresthesias of her feet and hands.  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  The patient has evidence of progressive neurologic deficits and progressive  weakness.   Other problems include:  1. peptic ulcer disease status post treatment for acute gastric ulcer with     GI bleed.  2. Coronary artery disease.  3. Chronic obstructive lung disease.  4. Hypertension.  5. Peripheral vascular disease.  6. Chronic depression.  7. Chronic thrombocytosis.   PAST SURGICAL HISTORY:  1. Hemorrhoidectomy in January 2004.  2. Cholecystectomy.   CURRENT MEDICATIONS:  1. Remeron 15 mg q.h.s.  2. Pletal 100 mg b.i.d.  3. Hydrocodone 7.5 four times a day for breakthrough pain.  4. Ambien 10 mg q.h.s.  5.  Avalide 150/12.5 daily.  6. Avinza 90 mg daily.   She was last seen in the office 3 June which is 5 days prior to admission.  She was stable at that time.   REVIEW OF SYSTEMS:  Positive for chronic insomnia, chronic pain in back and  legs, progressive numbness of her fingers and hand, worsening difficulty  with walking with a staggering gait and persistent dizziness, chronic  constipation.  Her blood pressure in the past has been running about 130/80,  but on admission to the emergency room, it was 90/70.   ALLERGIES:  ASPIRIN.   PHYSICAL EXAMINATION:  VITAL SIGNS:  Blood pressure 102/60, pulse 70,  respirations 20 and regular, temperature 100.2.  GENERAL:  The patient is alert and awake.  She is oriented to person, place,  and time.  HEENT:  Head exam reveals no evidence of recent trauma.  There is no  soreness of her head or face and no bruises or contusions.  Pupils are equal  and reactive.  Extraocular movements are full.  Pupils accommodate  appropriately.  Oropharynx is unremarkable.  Tympanic membranes are normal.  NECK:  Supple.  No tenderness, no rigidity, specifically no meningismus.  Range of motion is fully intact.  CHEST:  Clear to auscultation with good breath sounds bilaterally.  No  wheezes, rales,  rubs, or rhonchi.  HEART:  Regular rhythm with no ectopy.  Normal S1 and S2.  I do not detect  any murmur.  No gallop, S3, or S4 is heard.  BREASTS:  Atrophic, nontender.  No skin changes or nipple changes.  ABDOMEN:  Soft, nontender, mildly distended.  Good active bowel sounds.  Well-healed incision.  EXTREMITIES:  Mild swelling of left lower extremity, especially at the  ankle, lower leg, and the dorsum of the foot.  No swelling of the right.  No  joint deformity noted.  NEUROLOGIC:  The patient is oriented x 3.  Cranial nerves are intact.  She  has decreased grip strength in her upper extremities, especially on the left  when compared with the right.  This is a new finding.  She also has mild  left arm drift.  She has obvious food drop in the left and cannot dorsiflex  her left foot up to neutral.  Sensory reveals significant decrease sensation  in the feet and legs.  She has decreased sensation to pinprick as well as  light touch, and she cannot detect positioning. The patient cannot stand  unassisted, so gait and stance were not tested.   IMPRESSION:  1. Acute exacerbation of chronic neurologic disorder with suspected central     and peripheral neurologic deterioration.  Must consider primary central     demyelination disorder.  Must also consider multiple sclerosis.  2. Mild dehydration due to decreased p.o. intake and also mental status     change.  3. Mild hypotension in a patient with prior history of hypertension,     probable manifestation of dehydration and use of antihypertensive     medications and oral morphine.  4. Gastroesophageal reflux disease, presently stable.  5. Peptic ulcer disease with mild anemia, to be further evaluated.  6. Osteoarthritis.  7. Depression with anxiety, exacerbated by medical condition.    PLAN:  The patient will be admitted and hydrated.  We will get an MRI of her  brain and upper spinal cord.  Neurology consult will be obtained.  She  will be started on physical therapy for evaluation of ability to  ambulate and  care for herself.  Will also get case manager involved to investigate her  home situation since she may have deteriorated to the point where she cannot  maintain herself at home.                                               Dirk Dress. Katrinka Blazing, M.D.    LCS/MEDQ  D:  12/14/2002  T:  12/14/2002  Job:  [id number]

## 2010-11-23 NOTE — Consult Note (Signed)
[name], [name]                ACCOUNT NO.:  [id number]   MEDICAL RECORD NO.:  [id number]          PATIENT TYPE:  INP   LOCATION:  A227                          FACILITY:  [facility]   PHYSICIAN:  Lionel December, M.D.    DATE OF BIRTH:  1934-08-19   DATE OF CONSULTATION:  04/04/2006  DATE OF DISCHARGE:                                   CONSULTATION   REASON FOR CONSULTATION:  GI bleed, heme positive stools.   PHYSICIAN REQUESTING CONSULTATION:  [facility].   HISTORY OF PRESENT ILLNESS:  The patient is a 76 year old African-American  female who was admitted with complaints of weakness and right flank pain.  She just had not been feeling well over the past weekend. She describes the  pain as dull, sometimes sharp, located in the right flank area radiating to  the front side of her abdomen. It is intermittent. It seems to be worse with  bowel movements, movement and meals. Denies any dysuria, hematuria, melena,  rectal bleeding, nausea or vomiting, fever, chills. She has chronic  constipation at home and takes MiraLax 17 grams t.i.d. sometimes with good  results. She complains of pain in her legs and hands. She denies any chest  pain or shortness of breath.   She was found to have Hemoccult positive brown stools in the emergency  department on rectal examination. Her hemoglobin was 7.3, hematocrit 22.4,  MCV 82.1. She is scheduled to receive 3 units of packed red blood cells.  Notably back in November 2006, her hemoglobin was 9.9, hematocrit 29.8.   She has a long history of recurrent GI bleed secondary to peptic ulcer  disease. She is generally followed by Dr. Elpidio Anis. Her last episode was  in November 2006 requiring hospitalization. She was found to have  hemorrhagic gastric on EGD. H. pylori status is unknown. In September 2005,  she had an upper GI bleed secondary to a gastric ulcer. EGD by Dr. Loreta Ave in  [place]. She was transferred because of lack of ICU bed at the time  at  [facility]. In July 2004 Dr. Katrinka Blazing did an EGD which revealed a  subacute gastritis with a healing antral ulcer. Colonoscopy at that time  revealed a healed cecal ulcer which she was noted to have previously. She  has a history of Candida esophagitis in the past. She is on Plavix  chronically.   MEDICATIONS AT HOME:  1. Plavix 75 mg daily.  2. Potassium chloride 20 mEq daily.  3. Tramadol/APAP q.6 p.r.n.  4. Multivitamin daily.  5. MiraLax 17 grams t.i.d.  6. Zolpidem 10 mg q.h.s.  7. Xanax 0.5 mg t.i.d.  8. Alphagan drops left eye b.i.d.  9. Lasix 40 mg t.i.d.  10.Mirtazapine 30 mg q.h.s.  11.Travatan 1 drop left eye daily.  12.Metoprolol 100 mg b.i.d.  13.Avapro 300 mg daily.  14.Spiriva inhaler 1 puff daily.   ALLERGIES:  ASPIRIN causes hives. PENICILLIN causes hives. Intolerance to  TYLENOL which causes GI upset.   PAST MEDICAL HISTORY:  1. CAD status post MI 20 years ago and required a  stent.  2. History of COPD.  3. Cataracts.  4. Glaucoma.  5. Hypertension.  6. Demyelinating disease of the brain that is supposed to be followed by      Dr. Gerilyn Pilgrim but she has not seen him in some time.  7. Depression.  8. History of GI bleed as outlined above.  9. Status post cholecystectomy.  10.Hemorrhoidectomy.   FAMILY HISTORY:  Positive for breast and cervical cancer.   SOCIAL HISTORY:  She lives in [place], she lives alone. She smokes one pack of  cigarettes daily for now with a 50 pack year history of tobacco use, no  alcohol use.   REVIEW OF SYSTEMS:  See HPI for GI, GU, constitutional and cardiopulmonary.   PHYSICAL EXAMINATION:  VITAL SIGNS:  Temperature 97, pulse 83, respirations  19, blood pressure 133/68.  GENERAL:  Pleasant, obese, elderly black female who appears in some mild  distress. She is complaining of leg pain which she states is due to her  intermittent sequential compression stockings. She complains of abdominal  pain/right flank pain. She  complains of headache.  SKIN:  Warm and dry, no jaundice.  HEENT:  Sclera nonicteric. Oral pharyngeal mucosa moist and pink.  CHEST:  Lungs clear to auscultation.  CARDIAC:  Reveals regular rate and rhythm. Normal S1, S2. No murmurs, rubs  or gallops.  ABDOMEN:  Positive bowel sounds, obese but symmetrical. Mild epigastric  tenderness to deep palpation. No organomegaly or masses, no rebound  tenderness or guarding.  EXTREMITIES:  No edema.   LABORATORY DATA:  As mentioned in the HPI. In addition, her white count is  8600, platelets 965,000. INR 1, BUN 10, creatinine 1.2, glucose 87. Total  bilirubin 0.5, alkaline phosphatase 99, AST 20, ALT 13, albumin 3.2. Lipase  37, urinalysis is negative.   CT without contrast revealed renal cyst, large hiatal hernia, degenerative  changes of the spine. Right lung base 1.8 cm soft tissue density inseparable  from the right hemidiaphragm which is unchanged from November 2006 study.   IMPRESSION:  The patient is a 76 year old lady with a history of  gastrointestinal bleed secondary to peptic ulcer disease and gastritis in  the past who now presents with profound normocytic anemia and heme positive  stool. She is on chronic Plavix therapy. I suspect we are dealing with a  chronic occult gastrointestinal bleed possibly related to ongoing peptic  ulcer disease or AVMs. Cannot exclude lower gastrointestinal source such as  polyps or colorectal cancer.   RECOMMENDATIONS:  Will discuss further with Dr. Karilyn Cota. May consider repeat  EGD initially. If there is no finding, she may need to have a followup  colonoscopy prior to giving small bowel capsule. For now agree with  transfusions as needed. Will followup anemia panel. Continue IV Protonix  q.12h for now.      Tana Coast, P.A.      Lionel December, M.D.  Electronically Signed   LL/MEDQ  D:  04/04/2006  T:  04/04/2006  Job:  [id number]   cc:   [facility]   Erle Crocker, MD

## 2010-11-23 NOTE — Op Note (Signed)
NAMEPALLAS, WAHLERT                ACCOUNT NO.:  [id number]   MEDICAL RECORD NO.:  [id number]          PATIENT TYPE:  INP   LOCATION:  A227                          FACILITY:  [facility]   PHYSICIAN:  Lionel December, M.D.    DATE OF BIRTH:  06/10/1935   DATE OF PROCEDURE:  04/04/2006  DATE OF DISCHARGE:                                 OPERATIVE REPORT   PROCEDURE:  Esophagogastroduodenoscopy.   INDICATIONS:  Ms. Bachtell is a 76 year old African-American female who was  admitted yesterday with flank pain and found to be profoundly anemic with a  hemoglobin of 7.3 g and her stool was guaiac-positive.  She has history of  peptic ulcer disease.  She is on Plavix chronically for antiplatelet  function.  She is undergoing diagnostic EGD.  The procedure risks were  reviewed the patient, informed consent was obtained.   MEDICATIONS FOR CONSCIOUS SEDATION:  Benzocaine spray for pharyngeal topical  anesthesia.  Demerol 50 mg IV, 25 mg of which was given in the holding area  for headache.  Versed 6 mg IV in divided dose.   FINDINGS:  Procedure performed in endoscopy suite.  The patient's vital  signs and O2 saturation were monitored during procedure and remained stable.  The patient was placed in the left lateral left lateral recumbent position  and Olympus video scope was passed via oropharynx without any difficulty  into esophagus.   Esophagus:  Mucosa of the esophagus was normal.  The body was somewhat  tortuous.  Mucosa of the esophagus normal.  GE junction was located at 33 cm  from the incisors.  Hiatus was at 39 cm from the incisors was wide open.  Had dependent segment on the left side.  Mucosa of the herniated part of the  stomach was carefully examined.  There was a small scar along the lesser  curvature side, but no ulcer or bleeding lesions were noted.   Stomach:  It was empty and distended very well with insufflation.  Folds of  the proximal stomach were normal.  Examination of the  mucosa revealed single  erosion at antrum.  Pyloric channel was patent.  Angularis, fundus and  cardia were examined by retroflexing the scope and were normal.  Hernia was  easily seen in this view as well.   Duodenum:  Bulbar mucosa was normal.  Scope was passed to the second and  third part of the duodenum, where mucosa and folds were normal.  Endoscope  was withdrawn.  The patient tolerated the procedure well.   FINAL DIAGNOSES:  1. Moderate-sized sliding hiatal hernia with a scar in it but no active      ulceration.  2. Erosive antral gastritis.  3. Normal examination of the first, second and third part of the duodenum.   No lesions found on this exam to account for the patient's profound anemia  and occult gastrointestinal bleed.   RECOMMENDATIONS:  1. Colonoscopy if the patient is it is agreeable.  2. Continue to hold Plavix for the time being.      Lionel December, M.D.  Electronically Signed  NR/MEDQ  D:  04/04/2006  T:  04/07/2006  Job:  [id number]   cc:   Erle Crocker, M.D.

## 2010-11-23 NOTE — H&P (Signed)
[name], [name]                ACCOUNT NO.:  [id number]   MEDICAL RECORD NO.:  [id number]          PATIENT TYPE:  EMS   LOCATION:  ED                            FACILITY:  [facility]   PHYSICIAN:  Vania Rea, M.D. DATE OF BIRTH:  25-Aug-1934   DATE OF ADMISSION:  05/30/2005  DATE OF DISCHARGE:  LH                                HISTORY & PHYSICAL   PRIMARY CARE PHYSICIAN:  Jerolyn Shin C. Katrinka Blazing, M.D.   CHIEF COMPLAINT:  Vomiting blood and passing bright red blood per rectum  since yesterday.   HISTORY OF PRESENT ILLNESS:  This is a 76 year old African-American lady  with a history of peptic ulcer disease, gastroesophageal reflux disease and  a past history of gastrointestinal bleed relating to peptic ulcer disease.  She apparently was in her fair state of health until yesterday.  She visited  her doctor for routine check up, received pain medications which apparently  were not NSAID's but subsequently began to have vomiting last night with  blood and associated bloody melenic stools.  The patient came to the  emergency room where the Hospitalist service was called to assist with  admission.  The patient is feeling somewhat sick and irritable and says she  is too sick to cooperate fully with admission interview but a lot of  information has been gleaned from her past notes.  She admits also to having  headaches and joint pains as well as abdominal pain.   PAST MEDICAL HISTORY:  Includes peptic ulcer disease, gastroesophageal  reflux disease, anxiety and depression, past history of upper  gastrointestinal bleed.   MEDICATIONS:  Include Toprol XL, Ambien, Levaquin, Avapro, Remeron, Lorcet,  diltiazem, Xanax, Reglan.   ALLERGIES:  Include ASPIRIN and PENICILLIN.   SOCIAL HISTORY:  Smokes one pack per day. She denies alcohol or illicit drug  use.   FAMILY HISTORY:  Father died earlier.  Mother is in good health.   REVIEW OF SYSTEMS:  Other than noted above, unable to elicit any  further on  10 point review of systems.  She has referred me to her primary care  physician to get any further information I need.   PHYSICAL EXAMINATION:  GENERAL APPEARANCE:  Elderly, African-American lady  lying in bed.  She seems to be distressed by pain.  VITAL SIGNS:  Her temperature is 99, pulse varies between 112 and 130 and  regular.  Respirations 20.  Blood pressure is 168/71 and then 181/112, her  pain is described as 10/10.  HEENT: Pupils are equal and round. Mucous membranes are pink and anicteric.  NECK:  She has no jugular venous distention, no thyromegaly.  CHEST:  Clear to auscultation bilaterally.  CARDIOVASCULAR:  She is tachycardiac.  ABDOMEN:  Her abdomen is somewhat distended and she has epigastric  tenderness.  EXTREMITIES:  Are without edema.  She seems to have tenderness of both  wrists.  NEUROLOGICAL:  Otherwise on central nervous system she has no focal  neurological deficits.   LABORATORY DATA:  White blood cell count is 11.1, hematocrit 12.5,  hematocrit 37.5, platelet count 129,000.  Her sodium is 141, potassium 2.6,  chloride 99, cO2 32, glucose 160, BUN 12, creatinine 0.8, her calcium is  9.5, total protein 7.3, albumin 3.6, AST 20, ALT 18, alkaline phosphatase  111, total bilirubin 0.6.  Her lipase is 36.  A urinalysis is unremarkable.   ASSESSMENT:  1.  Acute upper gastrointestinal bleed.  2.  Hypertension, uncontrolled.  3.  History of peptic ulcer disease.  4.  Severe hypokalemia.   PLAN:  As per orders.      Vania Rea, M.D.  Electronically Signed     LC/MEDQ  D:  05/30/2005  T:  05/30/2005  Job:  [id number]   cc:   Dirk Dress. Katrinka Blazing, M.D.  Fax: [phone]

## 2010-11-23 NOTE — Group Therapy Note (Signed)
[name], [name]                ACCOUNT NO.:  [id number]   MEDICAL RECORD NO.:  [id number]          PATIENT TYPE:  INP   LOCATION:  A203                          FACILITY:  [facility]   PHYSICIAN:  Margaretmary Dys, M.D.DATE OF BIRTH:  1934/08/14   DATE OF PROCEDURE:  10/18/2006  DATE OF DISCHARGE:                                 PROGRESS NOTE   SUBJECTIVE:  Patient admitted with an upper GI bleed. The patient is  noted to have a gastric ulcer on EGD.   She also has a sliding hiatal hernia.   The patient had a single episode of coffee-ground emesis yesterday. She  has not had anymore episodes. She thinks her appetite is better. She is  trying to eat some food this morning. She is currently on a full liquid  diet. The patient has also been closely followed by GI. She also reports  some mild epigastric pain. She denies any fevers or chills.   OBJECTIVE:  GENERAL:  Conscious, alert, comfortable, not in acute  distress.  VITAL SIGNS:  Blood pressure was 149/84, pulse of 90, respirations 20,  temperature 98.4, oxygen saturation was 94% on room air.  HEENT:  Normocephalic, atraumatic. Oral mucosa was moist. No exudates.  NECK:  Supple. No JVD or lymphadenopathy.  LUNGS:  Reduced air entry bilaterally. No crackles, wheezes or rhonchi  were heard.  HEART:  S1, S2, regular. No S3, S4, gallops or rubs.  ABDOMEN:  Soft, nontender. Bowel sounds were positive. No epigastric  tenderness was noted.  EXTREMITIES:  No pitting or pedal edema. No calf induration or  tenderness was noted.   LABORATORY/DIAGNOSTIC DATA:  White blood cell count was 8.6, hemoglobin  is stable at 9.6, hematocrit 27.3. Platelet count was 577 with 78%  neutrophils. Sodium was 139, potassium 3.4, chloride 107, CO2 25,  glucose 153. BUN 10, creatinine was 0.7, calcium is 8.4.   ASSESSMENT/PLAN:  1. Anemia with acute gastrointestinal blood loss likely secondary to a      gastric ulcer. The patient had an EGD which  confirmed his gastric      ulcer. Recommendation for now is to continue on proton pump      inhibitor and monitoring of H&H. Recommendation also for the hiatal      hernia to be evaluated by surgery when she is more stable.  2. History of asthma. The patient is stable with no acute exacerbation      since being hospitalized. Will continue on current      nebulizer/inhaler therapy.  3. Hypokalemia. Would continue to replace at this time.  4. History of hypertension. Stable. Currently on therapy with Lotensin      and Lopressor 100 mg p.o. b.i.d. will continue to watch.   DISPOSITION:  The patient is stable, her H&H is stable. She is also  hemodynamically stable. Will recheck a CBC in the morning tomorrow. If  the patient is stable may likely be able to go home in the next 24-48  hours. Will discontinue IV fluids and encourage increased activity at  this time.  Margaretmary Dys, M.D.  Electronically Signed     AM/MEDQ  D:  10/18/2006  T:  10/18/2006  Job:  [id number]

## 2010-11-23 NOTE — Group Therapy Note (Signed)
[name], [name]                ACCOUNT NO.:  [id number]   MEDICAL RECORD NO.:  [id number]          PATIENT TYPE:  INP   LOCATION:  A227                          FACILITY:  [facility]   PHYSICIAN:  Margaretmary Dys, M.D.DATE OF BIRTH:  August 19, 1934   DATE OF PROCEDURE:  04/05/2006  DATE OF DISCHARGE:                                   PROGRESS NOTE   SUBJECTIVE:  The patient feels a little bit better although she wants  something to help her sleep.  She was on some anxiety medications at home.  She denies any fevers or chills, no abdominal pain, no melena stools.  The  patient was being planned for colonoscopy but she wants to hold off for a  few days.   OBJECTIVE:  GENERAL:  Conscious, alert, comfortable, not in acute distress.  VITAL SIGNS:  Blood pressure 125/79, pulse of 112, respirations 20,  temperature 99.2.  Oxygen saturation was 96% on room air.  HEENT:  Normocephalic and atraumatic.  Oral mucosa was moist.  No exudates.  NECK:  Supple.  No jugular venous distention, no lymphadenopathy.  LUNGS:  Clear with good air entry bilaterally.  HEART:  S1 and S2 regular.  No murmurs, gallops or rubs.  ABDOMEN:  Soft.  Nontender.  Bowel sounds positive.  No mass palpable.  EXTREMITIES:  No edema.   LABORATORY/DIAGNOSTIC DATA:  White blood cell count 8.3, hemoglobin 10.3,  hematocrit 30.9.  Platelet count was 785.  Neutrophils of 84%.  Sodium was  140, potassium 3.6, chloride 106, CO2 28, glucose 137, BUN 6, creatinine  0.8.  Calcium 8.7.   ASSESSMENT/PLAN:  1. Severe anemia, question GI bleed.  The patient had an upper endoscopy      which was unremarkable.  Patient planned for a total colonoscopy which      is pending at this time.  Overall the patient is hemodynamically stable      with improvement in her H&H.  2. Hypertension.  Blood pressure is stable.  Will continue to hold blood      pressure medication as I think she is at a significant risk for another      bleed as we do not  know where she is bleeding from.  3. Anxiety/insomnia.  Will resume all home medications.  4. COPD is stable with no active wheezing.  Will continue on Albuterol as      needed and Spiriva inhaler once a day.   Plavix remains on hold at this time.   DISPOSITION:  Plan colonoscopy.  Continue to monitor H&H.  Will resume some  of her home medications.      Margaretmary Dys, M.D.  Electronically Signed     AM/MEDQ  D:  04/05/2006  T:  04/05/2006  Job:  [id number]

## 2010-11-23 NOTE — Consult Note (Signed)
[name], [name]                ACCOUNT NO.:  [id number]   MEDICAL RECORD NO.:  [id number]          PATIENT TYPE:  INP   LOCATION:  A203                          FACILITY:  [facility]   PHYSICIAN:  Kassie Mends, M.D.      DATE OF BIRTH:  1935-01-28   DATE OF CONSULTATION:  10/16/2006  DATE OF DISCHARGE:                                 CONSULTATION   REASON FOR CONSULTATION:  Upper GI bleed, anemia.   REQUESTING PHYSICIAN:  [facility]   HISTORY OF PRESENT ILLNESS:  The patient is a 76 year old African-  American female who presented with complaints of shortness of breath,  abdominal pain, hematemesis and hemoptysis.  She has a long history of  anemia with GI bleed in the past.  We last saw her in September 2007  when she was hospitalized for the same.  At that time she had heme  positive stools.  Her hemoglobin was in the 7 range.  She underwent EGD  which revealed a moderate size sliding hiatal hernia with scar but no  active peptic ulcer disease, erosive antral gastritis.  Colonoscopy by  Dr. Cira Servant revealed small internal hemorrhoids.  Her workup was completed  with a small-bowel capsule.  However, she was only found to have two  small gastric AVMs which were nonbleeding.  There was iron-stained  mucosa of the small bowel which limited the study somewhat and it was  felt that she most  likely was having chronic occult GI bleed from small-  bowel and gastric AVMs.   She presents now with a 1-week history of epigastric pain and several  episodes of nausea and vomiting.  She describes coffee-ground emesis.  She has also been spitting up blood.  She says she has been short of  breath.  She saw Dr. Jen Mow a couple of days ago and was treated in the  office.  She does not know if her hemoglobin was checked, however.  Because of her persistent shortness of breath she presented to the  emergency department.  She denies any NSAIDs or aspirin use.  She is on  Plavix daily.  Denies any  fever or chills.  She states her stools are  very dark on iron therapy.   MEDICATIONS AT HOME:  1. Nu-Iron 150 mg b.i.d.  2. Colace 100 mg b.i.d.  3. Protonix 40 mg daily.  4. K-Dur 20 mEq b.i.d.  5. Multivitamin daily.  6. MiraLax t.i.d.  7. P.r.n. Tramadol 50 mg q.6h. p.r.n.  8. Lasix 40 mg b.i.d.  9. Mirtazapine 30 mg q.h.s.  10.Travatan one drop left eye daily.  11.Metoprolol 100 mg b.i.d.  12.Avapro 300 mg daily.  13.Cymbalta 60 mg b.i.d.  14.Spiriva one puff daily.  15.Neurontin 600 mg t.i.d.  16.Xanax 0.25 mg t.i.d. p.r.n.  17.Alphagan drops one drop left eye b.i.d.   ALLERGIES:  PENICILLIN and ASPIRIN.   PAST MEDICAL HISTORY:  History of peptic ulcer disease.  She was  followed for a long time by Dr. Elpidio Anis.  In September 2005 she had  an upper GI bleed secondary to  gastric ulcer.  EGD at that time was done  by Dr. Loreta Ave because there were no beds available at [facility].  She  also has a history of a cecal ulcer on colonoscopy several years ago.  Most recent workup as outlined above.  She has a history of CAD status  post stent in the remote past, COPD, cataracts, glaucoma, hypertension,  demyelinating disease of the brain, depression, cholecystectomy,  hemorrhoidectomy.   FAMILY HISTORY:  Positive for breast and cervical cancer.   SOCIAL HISTORY:  She lives in [place].  Her daughter stays with her.  She  smokes one pack of cigarettes daily.  No alcohol use.  She has seven  children, two are deceased.   REVIEW OF SYSTEMS:  See HPI for GI and cardiopulmonary.  CONSTITUTIONAL:  Denies any unintentional weight loss.   PHYSICAL EXAMINATION:  VITAL SIGNS:  Temperature 97.6, pulse 88,  respirations 20, blood pressure 146/83, weight 83.6 kg, height 64  inches.  GENERAL:  Pleasant, well-nourished, well-developed, 76 year old black female  in no acute distress.  SKIN:  Warm and dry, no jaundice.  HEENT:  Sclerae nonicteric.  Oropharyngeal mucosa moist and pink.  CHEST:   Lungs clear to auscultation.  CARDIAC:  Reveals regular rate and rhythm; no murmurs, rubs or gallops.  ABDOMEN:  Positive bowel sounds.  Abdomen soft, obese but symmetrical.  Mild to moderate epigastric tenderness to deep palpitation.  No rebound  tenderness or guarding.  No organomegaly or masses.  LOWER EXTREMITIES:  No edema.   LABORATORIES:  White count 10,000.  On admission, her hemoglobin was  8.3.  This dropped to 7.7 and post 2 units of packed red blood cells her  hemoglobin is up to 11.  Her MCV is 87.6; platelets 582,000.  Sodium  137, potassium 3.5, BUN 13, creatinine 0.77, glucose 182.  INR 1.  D-  dimer 1.22.  Total bilirubin 0.4, alkaline phosphatase 88, AST 18, ALT  12, albumin 2.9.  Hemoglobin in October 2007 was 9.3.  She had a V/Q  scan which revealed low probability for pulmonary embolus.  This was  done because of CT angiogram of the chest was indeterminate in the lower  lobes for PE.   IMPRESSION:  The patient is a 76 year old female with history of chronic  occult gastrointestinal bleed with anemia and remote peptic ulcer  disease who was admitted with shortness of breath, significant anemia,  and reported hematemesis and hemoptysis.  Her D-dimer is positive but  there is low probability for PE on nuclear medicine scan.  This morning  her breathing has much improved.  She has received 2 units of packed red  blood cells.  Her epigastric pain, nausea and vomiting has been  occurring for about 1 week.  Differential includes gastritis,  symptomatic large hiatal hernia, and peptic ulcer disease.   RECOMMENDATIONS:  1. Continue PPI b.i.d.  2. Will discuss further with Dr. Cira Servant with regards to possible EGD.      Tana Coast, P.A.      Kassie Mends, M.D.  Electronically Signed    LL/MEDQ  D:  10/16/2006  T:  10/16/2006  Job:  [id number]   cc:   Erle Crocker, M.D.

## 2010-11-23 NOTE — H&P (Signed)
NAME:  [name], [name]                          ACCOUNT NO.:  [id number]   MEDICAL RECORD NO.:  [id number]                   PATIENT TYPE:  INP   LOCATION:  2315                                 FACILITY:  [facility]   PHYSICIAN:  Melissa L. Ladona Ridgel, MD               DATE OF BIRTH:  1934-08-03   DATE OF ADMISSION:  03/10/2004  DATE OF DISCHARGE:                                HISTORY & PHYSICAL   PRIMARY CARE PHYSICIAN:  Dr. Katrinka Blazing; her case was discussed, however, with  Dr. Evlyn Courier.   CHIEF COMPLAINT:  Vomiting bright red blood and blood per rectum.   HISTORY OF PRESENT ILLNESS:  The patient is a 76 year old African American  female who states that she awoke feeling fine today.  She got up and when  talking with her daughter and after shopping, patient states that she  returned home and developed bright red blood per rectum times at least 2  episodes and some vomiting of blood.  She states that she has never had this  before; she denied melena or hematochezia; she denies fever, chills or  diarrhea.   ALLERGIES:  Her allergies are to ASPIRIN and PENICILLIN.   MEDICATIONS:  1.  She takes a multivitamin once daily.  2.  She also takes Lortab 7.5/50 mg as needed.  3.  She takes Dyazide 37.5/25 mg as needed.  4.  She takes Xanax 0.5 mg p.o. t.i.d. p.r.n.  5.  She takes Prevacid 30 mg p.o. b.i.d.  6.  Senna-S 2 tablets nightly.  7.  Avalide 150/12.5 mg.  8.  Ambien 10 mg daily.  9.  She also is showing good response to her Avinza, which is a form of      morphine sulfate.  10. She is also on Pletal 100 mg p.o. b.i.d.  11. Remeron 15 mg p.o. daily.   PAST MEDICAL HISTORY:  1.  Polyneuropathy/myopathy, probably in the left leg more than the right.  2.  Chronic anemia.  3.  GERD.  4.  Osteoarthritis.  5.  Peptic ulcer disease with the last ulcer being visualized in the antrum.  6.  She has depression and anxiety.   SOCIAL HISTORY:  She smokes a pack a day.  She denies any  ethanol use and  she denies any religious belief.   FAMILY HISTORY:  She has a niece who has lung cancer, a father who is  deceased from her life early in her 38's and a mother who is in good health.  She has 5 children, 2 of which are deceased.   REVIEW OF SYSTEMS:  Review of systems is as per HPI.  She denies any muscle  weakness, but is tired and fatigued.  As stated, there were no fever or  chills.  She has had some loose stools but is unwilling to state that this  is abnormal for her.   PERTINENT  LABORATORY VALUES:  Her pertinent laboratory values reveal a  sodium of __________; potassium is 2.8, chloride is 114, CO2 is 23, BUN is  44, creatinine is 1.1.  LFTs are within normal limits.  Her sodium is  __________.  Hemoglobin of 7.7, hematocrit of 21.7 with an 82% neutrophilic  shift with 3% bands.   Pending is an acute abdominal series.   PHYSICAL EXAMINATION:  VITAL SIGNS:  Admitting vitals revealed a temperature  of 98.7; blood pressure was 75/44 on admission with a pulse of 112;  respiratory rate is 22.  She is on room air, saturating 92% to 97%.  GENERAL:  This is an ill-appearing African American female in no acute  distress, but appears groggy and fatigued.  HEENT:  She is normocephalic, atraumatic.  Pupils are equal, round and  reactive to light.  Extraocular muscles are intact.  Mucous membranes are  moist with no oral lesion.  Dentures.  NECK:  Her neck is supple.  There is no JVD and no lymph nodes.  There is an  area of nodularity in the left lobe of the thyroid which will need  investigation, once the patient stabilizes.  CHEST:  Her chest is clear to auscultation.  There are no rhonchi, rubs or  wheezes.  CARDIOVASCULAR:  Regular rate and rhythm; positive S1 and S2; no S3 or S4;  no murmurs, rubs, or gallops.  ABDOMEN:  Abdomen is soft with some epigastric tenderness; she is otherwise  nonfocal; this is a diffuse process.   ASSESSMENT AND PLAN:  1.  This is a  77 year old African American female with acute onset of      hematemesis and bright red blood per rectum.  Currently, the patient has      been stable without any further events.  She is currently being volume-      resuscitated.  We will continue her packed red blood cells and her      generous intravenous fluids.  We will check a hemoglobin and hematocrit      after the next transfusion.  I will start her on some Protonix      intravenously b.i.d. and we will be checking an acute abdominal series      to look for free air and we will request from nursing, a documentation      of the number and quality of stools.  Please note that the patient will      need to possibly have an esophagogastroduodenoscopy and colonoscopy as      soon as possible; we will discuss this with the gastroenterology      attending who will be seeing this patient.  2.  Genitourinary:  There is a Foley to gravity.  We will check a UA/C&S.  3.  Pulmonary:  The patient is certainly not 100% stable, but doing well.      Blood pressure remains appropriate.  4.  Cardiovascular:  She has a history of a myocardial infarction and we      will therefore try to keep her hemoglobin and hematocrit slightly on the      higher side at above 25 mcg/dl for her hematocrit.  5.  The patient appears to have a left lower lobe thyroid nodule.  We will      check an ultrasound when the patient is stable.  For now, we will check      a TSH, T3 and T4.  6.  Tobacco abuse:  We will order  a cessation consult and provide the      patient with a Nicoderm patch.  7.  The patient at this time is a full-code/full-care.  We will get a hold      of Dr. Anselmo Rod to let her know the patient is here and to      continue to progress towards whatever plan gastroenterology would have      for her.                                                Melissa L. Ladona Ridgel, MD   MLT/MEDQ  D:  03/10/2004  T:  03/10/2004  Job:  [id number]

## 2010-11-23 NOTE — Op Note (Signed)
[name], [name]                ACCOUNT NO.:  [id number]   MEDICAL RECORD NO.:  [id number]          PATIENT TYPE:  INP   LOCATION:  A203                          FACILITY:  [facility]   PHYSICIAN:  [name], M.D.    DATE OF BIRTH:  [birth year]   DATE OF PROCEDURE:  10/20/2006  DATE OF DISCHARGE:                                PROCEDURE NOTE   PROCEDURE:  Esophagogastroduodenoscopy with therapeutic intervention.   ENDOSCOPIST:  [name], M.D.   INDICATIONS:  [name] is a 76 year old Caucasian female who presented  last week with upper GI bleed.  She underwent esophagogastroduodenoscopy  by Dr. Cira Servant on October 17, 2006 and she was found to have a large ulcer  at the diaphragmatic hiatus [name] lesion).  She has required  transfusion.  Yesterday morning, her H&H had dropped and she had coffee-  ground emesis.  She is back on IV therapy and undergoing EGD with  therapeutic intention.  She was given a unit of blood yesterday and  hemoglobin was 9.6 and this morning is 8.9.  She has not had any  hematemesis in over 24 hours.  Procedure risks were reviewed with the  patient and informed consent was obtained.   The patient was complaining of sore throat and coughing while in our  facility.   MEDICATIONS FOR CONSCIOUS SEDATION:  Benzocaine spray for oropharyngeal  topical anesthesia, Demerol 50 mg IV, Versed 4 mg IV.   FINDINGS:  Procedure was performed in endoscopy suite.  The patient's  vital signs and O2 SATs were monitored during the procedure and remained  stable.  The patient was placed in the left lateral decubitus position  and Pentax videoscope was passed via oropharynx without any difficulty  into esophagus.   ESOPHAGUS:  Mucosa of the esophagus was normal.  Her GE junction was at  33 cm from the incisors and was unremarkable.   She had large sliding hiatal hernia and diaphragmatic hiatus was at 40  cm and was wide open.  There was an ulcer at least 2 x 3 cm  located  along the anterior wall at the level of diaphragmatic hiatus, extending  towards the anterior wall, close to the lesser curvature.  There was  some fresh blood right at the distal margin of this ulcer.  There was no  spurting or active bleeding noted.   STOMACH:  It was empty and distended very well with insufflation.  Folds  of the proximal stomach were normal.  Mucosa at body, antrum and pyloric  channel as well as angularis was normal.  This hernia and ulcer were  easily seen on retroflex view.   DUODENUM:  Bulbar mucosa was normal.  Scope was passed into the second  part of duodenum, where mucosa and folds were normal.   Endoscope was withdrawn and into the stomach.  Attention was directed to  this ulcer.  It was washed with a jet of water.  There was slight oozing  from one area at distal ulcer margin and there was some oozing right  from the center.  There was no  visible blood vessel.  Both of these  areas were coagulated with a Gold Probe.  The ulcer was washed again and  no bleeding was noted.  Stomach was deflated and endoscope was  withdrawn.  The patient tolerated the procedure well.   FINAL DIAGNOSIS:  1. Large, that is, 3 x 2-cm ulcer at the diaphragmatic hiatus with      stigmata of bleeding.  Two sites with oozing were coagulated using      Gold Probe.  2. Large sliding hiatal hernia.   RECOMMENDATIONS:  1. Full liquids.  The patient advised to be sitting upright for 2      hours after each meal.  2. We will continue IV Protonix and IV Reglan for 48 hours.  3. Carafate 1 g a.c. and nightly.  4. Cepastat lozenges for her throat symptoms.  As discussed with Dr.      Elige Radon (hospitalist), we will also get a chest x-ray to make sure      she has not had aspiration, since she had multiple episodes of      vomiting in the last 3 days.   The patient will have H&H later today and in the a.m.      [name], M.D.  Electronically Signed     NR/MEDQ   D:  10/20/2006  T:  10/21/2006  Job:  [id number]

## 2010-11-23 NOTE — Procedures (Signed)
   NAME:  [name], [name]                          ACCOUNT NO.:  [id number]   MEDICAL RECORD NO.:  [id number]                   PATIENT TYPE:  INP   LOCATION:  A222                                 FACILITY:  [facility]   PHYSICIAN:  Vida Roller, M.D.                DATE OF BIRTH:  19-Mar-1935   DATE OF PROCEDURE:  DATE OF DISCHARGE:                                  ECHOCARDIOGRAM   REFERRING PHYSICIAN:  Elliot Cousin, M.D.   TAPE NUMBER:  Tape number is [id number].   TAPE COUNT:  The tape count is 1748 through 2125.   IDENTIFYING DATA:  This is a 76 year old woman with tachycardia, mental  status changes and hypertension as well as chronic anemia.   STUDY:  The technique quality of the study was adequate for interpretation.   FINDINGS:   M-MODE TRACING:  Aorta was 25 mm.   Left atrium was 31 mm.   The septum is 11 mm.   The posterior wall is 11 mm.   Left ventricular diastolic dimension is 41 mm.  Left ventricular systolic  dimension is 31 mm.   TWO-DIMENSIONAL AND DOPPLER IMAGING:  The left ventricle is normal size with  normal systolic function.  Overall estimated ejection fraction is 50-55%.  There were no wall motion abnormalities.   The right ventricle is normal size with normal systolic function.   Both atria are normal size.   The aortic valve is trileaflet and tricommissure with no evidence of  stenosis or regurgitation.   The mitral valve is morphologically unremarkable with trace insufficiency  and no stenosis is seen.   The tricuspid valve is morphologically unremarkable with trace  insufficiency.  No stenosis is seen.   The pulmonic valve is not well seen.   The ascending aorta is normal size.   The inferior vena cava is small and contracted with no evidence of  respiratory variation.   The pericardial structures are normal.                                                 Vida Roller, M.D.    JH/MEDQ  D:  12/20/2002  T:  12/20/2002  Job:   [id number]

## 2010-11-23 NOTE — Discharge Summary (Signed)
[name], [name]                ACCOUNT NO.:  [id number]   MEDICAL RECORD NO.:  [id number]          PATIENT TYPE:  INP   LOCATION:  A227                          FACILITY:  [facility]   PHYSICIAN:  Osvaldo Shipper, MD     DATE OF BIRTH:  06/19/1935   DATE OF ADMISSION:  04/03/2006  DATE OF DISCHARGE:  10/02/2007LH                                 DISCHARGE SUMMARY   PRIMARY CARE PHYSICIAN:  Dr. Erle Crocker   DISCHARGE DIAGNOSES:  1. Possible chronic gastrointestinal blood loss requiring further      evaluation.  2. Anemia, likely secondary to blood loss as well as secondary to iron      deficiency.  3. Status post esophagogastroduodenoscopy and colonoscopy.  4. Abdominal pain, resolved.  5. History of coronary artery disease, stable.   HOSPITAL COURSE:  Briefly, this is a 76 year old African-American female who  has a history of coronary artery disease and has a previous history of  gastrointestinal blood loss who presented because of weakness and abdominal  pain.  The patient was found to have a hemoglobin of 7.3.  The patient was  transfused 2 units of blood.  Her stool was positive for Hemoccult blood.  Because of her history of gastrointestinal bleeding in the past and history  of hemorrhagic gastritis in the past, the patient was admitted to the  hospital and a gastroenterology consultation was obtained.  As mentioned  above, the patient was transfused 2 units of blood, which she had good  response to her hemoglobin.  Her coag profiles were unremarkable.  The  patient underwent EGD on April 04, 2006, which was done by Dr. Karilyn Cota,  which showed moderate sized hiatal hernia with no active ulceration, erosive  antral gastritis was also noted.  Duodenum was thought to be normal.   A colonoscopy was done a couple of days later which basically showed small  internal hemorrhoids, but otherwise normal colon.  Since no clear etiology  for her presumably GI blood loss was found,   capsule study was recommended  by Dr. Cira Servant.  This will be scheduled within one week as an outpatient.  The  patient did not have any further blood loss during the hospital stay.  Her  abdominal pain, which was vague to begin with, also improved.  She did have  a CT scan without contrast when she presented which showed renal cysts  without hydronephrosis or nephrolithiasis.  Possible pleural plaque was  found on the CT scan as well but no evidence for any acute abnormality was  noted.   The patient's iron profile study, which was sent off, suggested severe iron  deficiency anemia for which patient was started on Nu-iron as well.   While she was monitored on the telemetry floor she noted to be in sinus  tachycardia with two runs of nonsustained VT.  Review of her records  suggested echocardiogram from 2006 showing ejection fraction which was  normal.  It was noted that the patient was on beta blockers at home, which  was discontinued on admission because she was running low  blood pressures.  Hence, the most likely reason for her tachycardia is probably withdrawal  from beta blockers.  Regarding her non-sustained VT, because she has a  normal ejection fraction, I think the beta blocker therapy should be good  enough.  However, I think a repeat echocardiogram should be obtained, which  will be scheduled for this patient.  Since she has not been seen by a  cardiologist in a long time, an appointment with [facility] will  also be made to make sure the patient has stable cardiac disease.   DISCHARGE MEDICATIONS:  1. Nu-Iron 150 mg b.i.d.  2. Colace 100 mg p.o. b.i.d.  3. Protonix 40 mg p.o. daily.  4. She was asked to not continue taking Plavix until told so by a      physician.  5. She is also to resume her other outpatient medications including beta      blockers as before.   FOLLOWUP:  1. Followup with Dr. Cira Servant in two weeks. Appointment was set up for      May 08, 2006  at 1:45 P.M.  2. Capsule studies within one week.  3. Followup with [place] Cardiologist on April 18, 2006 at 1:45 P.M.  4. 2-dimensional echocardiogram on April 10, 2006 at 9:00 A.M.   Prior to discharge from the hospital the patient was given 50 mg of beta  blocker, metoprolol.  She was also given 40 mEq of K-Dur.   DIET:  She was ordered a high fiber diet.   ACTIVITY:  She is asked to increase her activity slowly.   OTHER DISCHARGE INSTRUCTIONS:  She was told that if she noted frank blood in  her stools or black colored stools she needed to seek attention immediately.   We were aware that the patient lived alone at home but she was pretty  independent with her activities of daily living and hence, we were  comfortable discharging her at this time.  She did, however, mention to me  that she was planning to move in with her daughter within the next few  weeks.   CONSULTATIONS:  Consultations obtained from gastroenterology service.   PROCEDURES:  Procedures undergone by the patient include a TCS and EGD.   Total time at discharge about 40 minutes.      Osvaldo Shipper, MD  Electronically Signed     GK/MEDQ  D:  04/08/2006  T:  04/08/2006  Job:  [id number]   cc:   Kassie Mends, M.D.  [address]  [place] , [place] [address]   Gerrit Friends. Dietrich Pates, MD, [facility]  [address]  [place], [place] [address]

## 2010-11-23 NOTE — Discharge Summary (Signed)
   NAME:  [name], [name]                          ACCOUNT NO.:  [id number]   MEDICAL RECORD NO.:  [id number]                   PATIENT TYPE:  INP   LOCATION:  A310                                 FACILITY:  [facility]   PHYSICIAN:  Jerolyn Shin C. Katrinka Blazing, M.D.                DATE OF BIRTH:  12/14/1934   DATE OF ADMISSION:  07/16/2002  DATE OF DISCHARGE:  07/19/2002                                 DISCHARGE SUMMARY   DISCHARGE DIAGNOSES:  1. Complex hemorrhoid disease.  2. Recurrent peptic ulcer disease.  3. Chronic iron deficiency anemia.  4. Coronary artery disease.  5. Chronic obstructive lung disease.  6. Hypertension.  7. Peripheral vascular disease.  8. Peripheral neuropathy.  9. Depression.  10.      Chronic thrombocytosis.   SPECIAL PROCEDURE:  Internal and external hemorrhoidectomy on July 16, 2002.   DISPOSITION:  The patient is discharged home in stable improved condition.   DISCHARGE MEDICATIONS:  1. Tenormin 50 mg daily.  2. Adalat 150/12.5 mg daily.  3. Prevacid 30 mg daily.  4. Megace two tablespoons daily.  5. Hemocyte one daily.  6. Levaquin 500 mg daily.  7. Tylox one q.4-6h. as needed for pain.   DISCHARGE INSTRUCTIONS:  The patient will take sitz baths every day and she  will use cloth soaked in witch hazel solution on a p.r.n. basis for comfort.   SUMMARY:  A 76 year old female with history of hemorrhoid disease for a long  time.  She had a constant swelling of the perianal area.  She had  constipation.  She had a normal colonoscopy in March 2002.  Because of the  size of her hemorrhoids she had been advised for quite some time to have  hemorrhoidectomy but she delayed it until she had severe pain.  Past history  is given in the admission notes.  Examination was unremarkable except for  large internal and external hemorrhoids.  The patient was admitted through  day surgery on January 9 and underwent internal and external  hemorrhoidectomy without incident.   Three large complexes were removed.  In  the  postoperative period she had some pain which was controlled with analgesics,  but otherwise did very well.  She has healed quite nicely.  She is having  regular bowel movements.  There is no bleeding and no significant drainage.  The patient is discharged home in stable satisfactory condition.                                               Dirk Dress. Katrinka Blazing, M.D.    LCS/MEDQ  D:  07/19/2002  T:  07/19/2002  Job:  [id number]

## 2010-11-23 NOTE — Op Note (Signed)
[name], [name]                ACCOUNT NO.:  [id number]   MEDICAL RECORD NO.:  [id number]          PATIENT TYPE:  INP   LOCATION:  A203                          FACILITY:  [facility]   PHYSICIAN:  Kassie Mends, M.D.      DATE OF BIRTH:  1934-08-10   DATE OF PROCEDURE:  10/16/2006  DATE OF DISCHARGE:                                PROCEDURE NOTE   REFERRING PHYSICIAN:  Dr. Skeet Latch.   PROCEDURE:  Esophagogastroduodenoscopy with cold forceps biopsy.   INDICATION FOR EXAMINATION:  [name] is a 76 year old female who  presented to the emergency department with coughing and shortness of  breath.  She also had a week's worth of vomiting with coffee ground  emesis.  She has a significant past medical history of iron-deficiency  anemia and a large hiatal hernia.  She also has a known history of  gastric AVMs and small bowel AVMs.   FINDINGS:  1. Large hiatal hernia with a 2-cm x 3-cm gastric ulcer contained      within this sliding hiatal hernia.  The ulcer was biopsied via cold      forceps.  Six biopsies were obtained in the periphery and two      within the center of the ulcer.  No visible vessel was seen within      the ulcer.  No fresh blood in the stomach.  The etiology of the      ulcer is likely ischemic.  2. Otherwise, normal esophagus without evidence of Barrett's,      erosions, or ulcerations.  3. Normal duodenal bulb and second portion of the duodenum.   RECOMMENDATIONS:  1. Will call [name] with the results of her biopsies.  2. B.i.d. PPI.  3. would avoid Reglan and erythromycin as well as other pro-motility      agents.  4. Full liquid diet and advance to fat-modified diet as tolerated.  5. No aspirin, antiinflammatory drugs, or anticoagulation.  6. May check CBC daily.  [name]' gastric ulcer is low risk for      bleeding.  7. Continue p.o. iron.   FOLLOWUP:  With Dr. Cira Servant in one month.   MEDICATIONS:  1. Demerol 50 mg IV.  2. Versed 3 mg IV.   PROCEDURAL TECHNIQUE:  Physical exam was performed and informed consent  was obtained for the patient, after explaining the benefits, risks, and  alternatives to the procedure.  The patient was connected to the monitor  and placed in the left lateral position.  Continuous oxygen was provided  by nasal cannula and IV meds administered with an in-dwelling cannula.  After administration of sedation, the patient's esophagus was intubated  and scope was advanced under direct visualization to the second portion  of the duodenum.  The scope was subsequently moved  slowly by carefully examining the color, texture, anatomy, and integrity  of the mucosa on the way out.  The patient was recovered in endoscopy  and discharged to the floor in satisfactory condition.  I did discuss  [name]' endoscopy results with her daughter and Dr.  McDonald.  Her  daughter's phone number is [phone].      Kassie Mends, M.D.  Electronically Signed     SM/MEDQ  D:  10/16/2006  T:  10/16/2006  Job:  [id number]   cc:   Erle Crocker, M.D.

## 2010-11-23 NOTE — Procedures (Signed)
NAMEJUDIETH, [name]                ACCOUNT NO.:  [id number]   MEDICAL RECORD NO.:  [id number]          PATIENT TYPE:  OUT   LOCATION:  RAD                           FACILITY:  [facility]   PHYSICIAN:  E. Graceann Congress, MD, FACCDATE OF BIRTH:  09-08-1934   DATE OF PROCEDURE:  DATE OF DISCHARGE:                                    STRESS TEST   The patient was given 44 mg Adenosine every 4 minutes.  She tolerated this  well with minimal tightness.  There were a few PVCs, but no other arrhythmia  and no ST depression.  Blood pressure did fall into the 90s transiently.  The patient tolerated this well.  Myoview results to follow.           ______________________________  E. Graceann Congress, MD, [facility]     EJL/MEDQ  D:  05/06/2006  T:  05/06/2006  Job:  [id number]

## 2010-11-23 NOTE — H&P (Signed)
NAMEDONNE, ROBILLARD                ACCOUNT NO.:  [id number]   MEDICAL RECORD NO.:  [id number]          PATIENT TYPE:  INP   LOCATION:  A215                          FACILITY:  [facility]   PHYSICIAN:  Margaretmary Dys, M.D.DATE OF BIRTH:  01/18/1935   DATE OF ADMISSION:  11/02/2006  DATE OF DISCHARGE:  LH                              HISTORY & PHYSICAL   PRIMARY CARE PHYSICIAN:  Erle Crocker, M.D.   ADMISSION DIAGNOSES:  1. Acute dyspnea.  2. Congestive heart failure.  3. Probable underlying pneumonia.  4. Recent surgery at [facility].  The patient had a      partial gastrectomy and sliding hiatal hernia repair.  I am still      waiting on the records from [facility].  5. History of gastrointestinal bleed with recent admission here on      October 15, 2006, and discharge on October 25, 2006 to [facility].   CHIEF COMPLAINT:  Increased shortness of breath and cough.   HISTORY OF PRESENT ILLNESS:  Ms. Arteaga is a 76 year old African-  American female, who is well known to hospitalists service.  She was  recently with our service for GI bleed, which was refractory, despite  multiple EGDs and also transfusion.  She was transferred to [facility] for further evaluation.  The patient informed me that she  had surgery, including partial gastrectomy and also hiatal hernia repair  and did feel well and was discharged home on Friday.  The patient said  that at home on Saturday when she woke up she was having increasing  shortness of breath and significant difficulty getting to the bathroom.  She denies any fevers or chills.  No pleuritic chest pain.  She is also  having increased pedal edema.  She denies any fevers or chills.  No  headaches or dizziness.  She was having some wheezing too.  Her cough  was mostly yellowish with no blood.   She is also having some drainage, mostly clay yellowish from laparotomy  scar.  Evaluation in the emergency  room including a CT scan of the chest  was negative for any evidence of pulmonary embolus, but she did have  bilateral pleural effusion, and also evidence of congestive heart  failure and some bibasilar atelectasis, possibility of pneumonia.  The  patient is now being admitted for further management.  We have requested  records from [facility].   REVIEW OF SYSTEMS:  A 10-point review of systems is otherwise negative  except as mentioned in history of present illness.   PAST MEDICAL HISTORY:  1. Recent gastrointestinal bleed secondary to gastric ulcer located      within the hiatal hernia pouch, status post surgery at [facility].  Exact details are pending.  2. Large hiatal hernia.  3. History of occult GI bleed secondary to gastric small bowel AV      malformations.  4. Coronary artery disease, status post myocardial infarction 20 years      ago, requiring stent  placement.  5. Chronic obstructive pulmonary disease.  6. Cataracts.  7. Glaucoma.  8. Hypertension.  9. History of demyelinating disease, followed by Dr. Gerilyn Pilgrim,      neurologist in [place], [place].  10.Depression.  11.Cholecystectomy.  12.Hemorrhoidectomy.   MEDICATIONS:  1. She is on Tramadol/acetaminophen 50 mg p.o. q. 6.  2. Multivitamin one p.o. daily.  3. MiraLax 17 g p.o. t.i.d.  4. Alprazolam 0.5 mg p.o. t.i.d.  5. Alphagan one drop to the left eye twice a day.  6. Mirtazapine 30 mg at bedtime.  7. Travatan one drop to left eye once a day.  8. Metoprolol 100 mg p.o. b.i.d.  9. Omeprazole 20 mg p.o. daily.  10.Gabapentin 600 mg p.o. t.i.d.  11.Hydroxyzine 250 mg as needed.  12.Benazepril 20 mg p.o. daily.  13.Buspirone 30 mg p.o. b.i.d.  14.Ferrous sulfate 325 mg p.o. once a day.  15.Metoclopramide 5 mg as needed.  16.Cymbalta 60 mg p.o. b.i.d.  17.Spiriva 80 mcg inhaled once a day.   ALLERGIES:  1. PENICILLIN.  She gets hives.  2. ASPIRIN.   SOCIAL HISTORY:  The  patient lives at home.  She smoked until recently.  She denied any alcohol or illicit drug use.  She has several children,  some of whom I met during her last hospitalization.   FAMILY HISTORY:  Positive for hypertension, diabetes, kidney disease,  breast cancer, and cervical cancer.   PHYSICAL EXAMINATION:  GENERAL:  Conscious, alert, comfortable, although  slightly tachypneic.  VITAL SIGNS:  Blood pressure on arrival in the emergency room was  119/65, pulse 105, respirations 26, temperature 98.3, oxygen saturation  was 95% on 3 L.  HEENT:  Normocephalic, atraumatic.  Oral mucosa was dry.  No exudates  were noted.  NECK:  Supple with some JVD bilaterally.  No cervical lymphadenopathy.  LUNGS:  Reduced air entry bilaterally with crackles at the bases.  She  also had some expiratory wheezes bilaterally.  HEART:  S1/S2 regular.  No S3, S4, gallops, or rubs.  ABDOMEN:  Post exploratory laparotomy scar was noted with drainage of  some clay yellowish fluid in the inferior portion of the incision, but  she had a vertical incision.  Bowel sounds were positive.  No masses  palpable.  EXTREMITIES:  Bilateral pitting peripheral edema with no calf induration  or tenderness noted.  NEUROLOGIC:  Well-oriented in time, place, and person.  No focal  neurological deficits were noted.   LABORATORY/DIAGNOSTIC DATA:  CT scan of the chest to rule out pulmonary  embolus was negative on acute PE.  She had multiple coronary and aortic  calcifications.  She also had bilateral pleural effusions, patchy  atelectasis, possible infiltrates.  She had ground glass opacities in  subpleural distribution in the middle and lower lobes.   A chest x-ray obtained earlier showed some bilateral atelectasis and  confirmation of findings on CT scan.   Blood gas on 2 L of oxygen showed a pH of 7.397, PCO2 49.4, PO2 of 70,  bicarbonate 29.7 with oxygen saturation of 94%.  WBC was 18.3, hemoglobin of 10.9, hematocrit  32.9, platelet count was 1023,  neutrophils of 91%.  D-dimer was 9.36.  Sodium was 143, potassium 3.4,  chloride of 106, CO2 31, glucose 157, BUN of 5, creatinine was 0.86,  calcium was 8.1.  Initial cardiac enzymes:  CK was 128, CK-MB was 4.8,  and troponin I was 0.24.  BNP was 893.   ASSESSMENT/PLAN:  Ms. Cowdrey is a 76 year old  African-American female  who was presenting in the hospital for GI bleed.  She was transferred to  [facility], where she had surgery.  The patient reports not  having trouble, immediate first postoperative day and was discharged  home two days ago.  However, at home she has had increasing shortness of  breath with pedal edema.  Symptoms are fairly consistent with possible  congestive heart failure, although pneumonia cannot be ruled out, though  much less likely.  Our plan is to admit her at this time to telemetry  floor.  Will diurese her with Lasix 40 mg intravenously q. 12.   She will also be started on Levaquin 750 IV once a day for presumed  pneumonia.  I will be obtaining records from [facility] to  find out exactly what was done.  We will resume her home medications.  Will put her on wet-to-dry dressing for her abdominal incision.   Will place her on potassium.   I will obtain cardiac enzymes q. 8 x2 in case she may be showing signs  of left ventricular strain.  Will also check an echocardiogram on her if  one was not done at [facility].  Her last echo was about  nine months ago and her left ventricular function was said to be normal.  She had evidence of diastolic dysfunction.   I will plan on DVT prophylaxis with Lovenox since she had prophylaxis  with Protonix .  I did notice an elevated platelet count.  This may be  reactive thrombocytosis.  Will monitor this closely.   I have discussed the above plan with the patient and she verbalized full  understanding.  Marland Kitchen      Margaretmary Dys, M.D.   Electronically Signed     AM/MEDQ  D:  11/02/2006  T:  11/03/2006  Job:  [id number]   cc:   Erle Crocker, M.D.

## 2011-04-17 LAB — COMPREHENSIVE METABOLIC PANEL
ALT: 13
ALT: 14
AST: 17
AST: 19
Albumin: 2.7 — ABNORMAL LOW
Albumin: 3.1 — ABNORMAL LOW
Alkaline Phosphatase: 55
Alkaline Phosphatase: 64
CO2: 28
Chloride: 107
Chloride: 109
GFR calc Af Amer: >60
GFR calc Af Amer: >60
GFR calc non Af Amer: >60
Potassium: 3.4 — ABNORMAL LOW
Potassium: 3.7
Sodium: 141
Sodium: 143
Total Bilirubin: 0.4
Total Bilirubin: 0.5
Total Protein: 5.6 — ABNORMAL LOW

## 2011-04-17 LAB — CROSSMATCH: ABO/RH(D): B POS

## 2011-04-17 LAB — CBC
HCT: 15.9 — ABNORMAL LOW
HCT: 16.9 — ABNORMAL LOW
HCT: 18.1 — ABNORMAL LOW
HCT: 23.2 — ABNORMAL LOW
HCT: 28.8 — ABNORMAL LOW
HCT: 29.4 — ABNORMAL LOW
Hemoglobin: 5 — CL
Hemoglobin: 5.3 — CL
Hemoglobin: 7.4 — CL
MCHC: 31.6
MCHC: 32.4
MCHC: 32.5
MCHC: 32.9
MCHC: 33
MCHC: 33.2
MCV: 102.3 — ABNORMAL HIGH
MCV: 91
MCV: 91.4
MCV: 92
MCV: 94.2
Platelets: 414 — ABNORMAL HIGH
Platelets: 458 — ABNORMAL HIGH
Platelets: 501 — ABNORMAL HIGH
Platelets: 572 — ABNORMAL HIGH
Platelets: 658 — ABNORMAL HIGH
RBC: 1.65 — ABNORMAL LOW
RBC: 2.42 — ABNORMAL LOW
RBC: 2.46 — ABNORMAL LOW
RBC: 3.12 — ABNORMAL LOW
RBC: 3.23 — ABNORMAL LOW
RBC: 3.25 — ABNORMAL LOW
RBC: 3.27 — ABNORMAL LOW
RDW: 19.3 — ABNORMAL HIGH
RDW: 20.1 — ABNORMAL HIGH
RDW: 20.3 — ABNORMAL HIGH
RDW: 20.8 — ABNORMAL HIGH
WBC: 10.1
WBC: 10.2
WBC: 10.5
WBC: 12 — ABNORMAL HIGH
WBC: 8.6

## 2011-04-17 LAB — IRON AND TIBC
Iron: 17 — ABNORMAL LOW
Saturation Ratios: 5 — ABNORMAL LOW
TIBC: 321
UIBC: 304

## 2011-04-17 LAB — BASIC METABOLIC PANEL
BUN: 5 — ABNORMAL LOW
CO2: 27
CO2: 28
Calcium: 8.1 — ABNORMAL LOW
Chloride: 110
Chloride: 111
Creatinine, Ser: 0.71
Creatinine, Ser: 0.72
GFR calc Af Amer: >60
GFR calc Af Amer: >60
GFR calc non Af Amer: >60
Glucose, Bld: 103 — ABNORMAL HIGH
Potassium: 3.8

## 2011-04-17 LAB — CARDIAC PANEL(CRET KIN+CKTOT+MB+TROPI)
CK, MB: 1.8
Relative Index: 1.4
Total CK: 142

## 2011-04-17 LAB — FERRITIN: Ferritin: 42 (ref 10–291)

## 2011-04-17 LAB — OCCULT BLOOD X 1 CARD TO LAB, STOOL: Fecal Occult Bld: POSITIVE

## 2011-04-17 LAB — HEMOGLOBIN A1C: Mean Plasma Glucose: 86

## 2011-04-17 LAB — TROPONIN I: Troponin I: 0.02

## 2011-04-17 LAB — CK TOTAL AND CKMB (NOT AT ARMC): Relative Index: 0.9

## 2011-04-17 LAB — MAGNESIUM: Magnesium: 2.1

## 2011-12-30 ENCOUNTER — Encounter: Payer: Self-pay | Admitting: *Deleted

## 2012-01-10 ENCOUNTER — Encounter: Payer: Self-pay | Admitting: *Deleted

## 2012-01-10 DIAGNOSIS — G629 Polyneuropathy, unspecified: Secondary | ICD-10-CM | POA: Insufficient documentation

## 2012-01-10 DIAGNOSIS — K279 Peptic ulcer, site unspecified, unspecified as acute or chronic, without hemorrhage or perforation: Secondary | ICD-10-CM | POA: Insufficient documentation

## 2012-01-10 DIAGNOSIS — D509 Iron deficiency anemia, unspecified: Secondary | ICD-10-CM | POA: Insufficient documentation

## 2012-01-10 DIAGNOSIS — M1990 Unspecified osteoarthritis, unspecified site: Secondary | ICD-10-CM | POA: Insufficient documentation

## 2012-01-10 DIAGNOSIS — I252 Old myocardial infarction: Secondary | ICD-10-CM | POA: Insufficient documentation

## 2012-01-10 DIAGNOSIS — K5909 Other constipation: Secondary | ICD-10-CM | POA: Insufficient documentation

## 2012-01-10 DIAGNOSIS — G379 Demyelinating disease of central nervous system, unspecified: Secondary | ICD-10-CM | POA: Insufficient documentation

## 2012-01-10 DIAGNOSIS — I471 Supraventricular tachycardia: Secondary | ICD-10-CM | POA: Insufficient documentation

## 2012-01-10 DIAGNOSIS — R739 Hyperglycemia, unspecified: Secondary | ICD-10-CM | POA: Insufficient documentation

## 2012-01-10 DIAGNOSIS — F329 Major depressive disorder, single episode, unspecified: Secondary | ICD-10-CM | POA: Insufficient documentation

## 2012-01-10 DIAGNOSIS — F419 Anxiety disorder, unspecified: Secondary | ICD-10-CM | POA: Insufficient documentation

## 2012-01-10 DIAGNOSIS — Z8719 Personal history of other diseases of the digestive system: Secondary | ICD-10-CM | POA: Insufficient documentation

## 2012-01-10 DIAGNOSIS — I739 Peripheral vascular disease, unspecified: Secondary | ICD-10-CM | POA: Insufficient documentation

## 2012-01-10 DIAGNOSIS — J449 Chronic obstructive pulmonary disease, unspecified: Secondary | ICD-10-CM | POA: Insufficient documentation

## 2012-01-10 DIAGNOSIS — F32A Depression, unspecified: Secondary | ICD-10-CM | POA: Insufficient documentation

## 2012-02-02 DIAGNOSIS — R112 Nausea with vomiting, unspecified: Secondary | ICD-10-CM

## 2012-03-04 DIAGNOSIS — J189 Pneumonia, unspecified organism: Secondary | ICD-10-CM

## 2012-03-05 DIAGNOSIS — J189 Pneumonia, unspecified organism: Secondary | ICD-10-CM

## 2012-04-07 DEATH — deceased

## 2016-03-06 ENCOUNTER — Telehealth: Payer: Self-pay | Admitting: Gastroenterology

## 2016-03-06 NOTE — Telephone Encounter (Signed)
Letter mailed

## 2016-03-06 NOTE — Telephone Encounter (Signed)
Pt is due 10 year colonoscopy °

## 2016-08-09 ENCOUNTER — Encounter: Payer: Self-pay | Admitting: Internal Medicine
